# Patient Record
Sex: Female | Born: 1980 | Race: Black or African American | Hispanic: No | Marital: Married | Smoking: Never smoker
Health system: Southern US, Community
[De-identification: ages and names within clinical notes are randomized; demographics above are authoritative.]

## PROBLEM LIST (undated history)

## (undated) DIAGNOSIS — R51 Headache: Secondary | ICD-10-CM

## (undated) HISTORY — PX: DILATION AND CURETTAGE OF UTERUS: SHX78

## (undated) HISTORY — PX: COLONOSCOPY: SHX174

## (undated) HISTORY — PX: CERVICAL CONIZATION W/BX: SHX1330

---

## 2002-04-13 ENCOUNTER — Other Ambulatory Visit: Admission: RE | Admit: 2002-04-13 | Discharge: 2002-04-13 | Payer: Self-pay | Admitting: Obstetrics and Gynecology

## 2002-12-03 ENCOUNTER — Other Ambulatory Visit: Admission: RE | Admit: 2002-12-03 | Discharge: 2002-12-03 | Payer: Self-pay | Admitting: Obstetrics & Gynecology

## 2003-02-02 ENCOUNTER — Ambulatory Visit: Admission: RE | Admit: 2003-02-02 | Discharge: 2003-02-02 | Payer: Self-pay | Admitting: Gynecology

## 2003-07-29 ENCOUNTER — Other Ambulatory Visit: Admission: RE | Admit: 2003-07-29 | Discharge: 2003-07-29 | Payer: Self-pay | Admitting: Obstetrics and Gynecology

## 2005-06-12 ENCOUNTER — Observation Stay (HOSPITAL_COMMUNITY): Admission: AD | Admit: 2005-06-12 | Discharge: 2005-06-13 | Payer: Self-pay | Admitting: Obstetrics and Gynecology

## 2005-06-13 ENCOUNTER — Encounter (INDEPENDENT_AMBULATORY_CARE_PROVIDER_SITE_OTHER): Payer: Self-pay | Admitting: *Deleted

## 2005-09-06 ENCOUNTER — Ambulatory Visit (HOSPITAL_COMMUNITY): Admission: RE | Admit: 2005-09-06 | Discharge: 2005-09-06 | Payer: Self-pay | Admitting: Obstetrics and Gynecology

## 2006-06-05 ENCOUNTER — Inpatient Hospital Stay (HOSPITAL_COMMUNITY): Admission: AD | Admit: 2006-06-05 | Discharge: 2006-06-05 | Payer: Self-pay | Admitting: Obstetrics & Gynecology

## 2006-06-06 ENCOUNTER — Inpatient Hospital Stay (HOSPITAL_COMMUNITY): Admission: AD | Admit: 2006-06-06 | Discharge: 2006-06-06 | Payer: Self-pay | Admitting: Obstetrics and Gynecology

## 2006-06-25 ENCOUNTER — Inpatient Hospital Stay (HOSPITAL_COMMUNITY): Admission: AD | Admit: 2006-06-25 | Discharge: 2006-06-25 | Payer: Self-pay | Admitting: *Deleted

## 2006-08-09 ENCOUNTER — Inpatient Hospital Stay (HOSPITAL_COMMUNITY): Admission: AD | Admit: 2006-08-09 | Discharge: 2006-08-11 | Payer: Self-pay | Admitting: Obstetrics and Gynecology

## 2006-08-12 ENCOUNTER — Encounter: Admission: RE | Admit: 2006-08-12 | Discharge: 2006-09-11 | Payer: Self-pay | Admitting: Obstetrics and Gynecology

## 2006-09-12 ENCOUNTER — Encounter: Admission: RE | Admit: 2006-09-12 | Discharge: 2006-10-11 | Payer: Self-pay | Admitting: Obstetrics and Gynecology

## 2006-10-12 ENCOUNTER — Encounter: Admission: RE | Admit: 2006-10-12 | Discharge: 2006-11-11 | Payer: Self-pay | Admitting: Obstetrics and Gynecology

## 2006-11-12 ENCOUNTER — Encounter: Admission: RE | Admit: 2006-11-12 | Discharge: 2006-12-11 | Payer: Self-pay | Admitting: Obstetrics and Gynecology

## 2006-12-12 ENCOUNTER — Encounter: Admission: RE | Admit: 2006-12-12 | Discharge: 2007-01-11 | Payer: Self-pay | Admitting: Obstetrics and Gynecology

## 2007-01-12 ENCOUNTER — Encounter: Admission: RE | Admit: 2007-01-12 | Discharge: 2007-02-11 | Payer: Self-pay | Admitting: Obstetrics and Gynecology

## 2007-02-12 ENCOUNTER — Encounter: Admission: RE | Admit: 2007-02-12 | Discharge: 2007-03-13 | Payer: Self-pay | Admitting: Obstetrics and Gynecology

## 2007-03-14 ENCOUNTER — Encounter: Admission: RE | Admit: 2007-03-14 | Discharge: 2007-04-13 | Payer: Self-pay | Admitting: Obstetrics and Gynecology

## 2007-04-14 ENCOUNTER — Encounter: Admission: RE | Admit: 2007-04-14 | Discharge: 2007-04-26 | Payer: Self-pay | Admitting: Obstetrics and Gynecology

## 2008-04-19 IMAGING — US US OB COMP +14 WK
1 series · 14 of 28 positions shown · non-contrast
Comparison: none

OBSTETRICAL ULTRASOUND:

 This ultrasound exam was performed in the [HOSPITAL] Ultrasound Department.  The OB US report was generated in the AS system, and faxed to the ordering physician.  This report is also available in [REDACTED] PACS.

[Series 1: us ob comp +14 wk · 0.28mm/px · 14 of 40 slices shown]
[im 2/40]
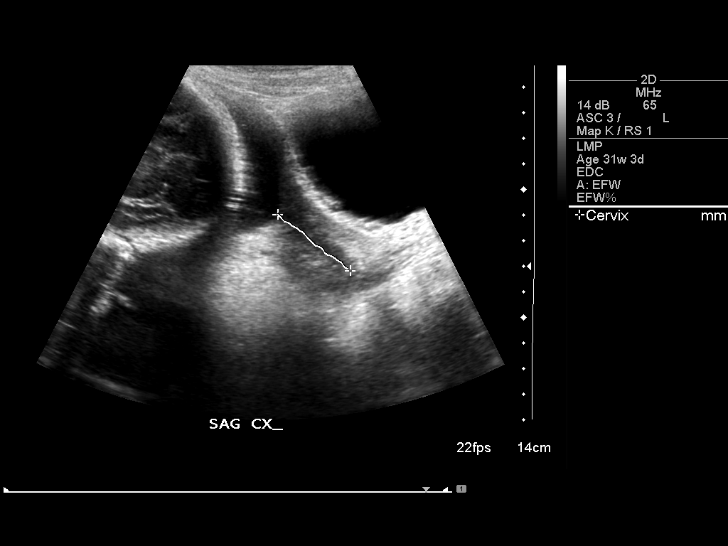
[im 5/40]
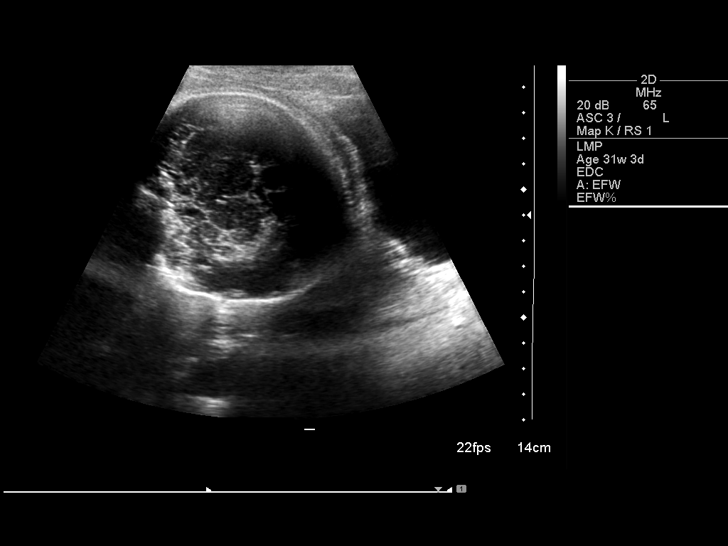
[im 8/40]
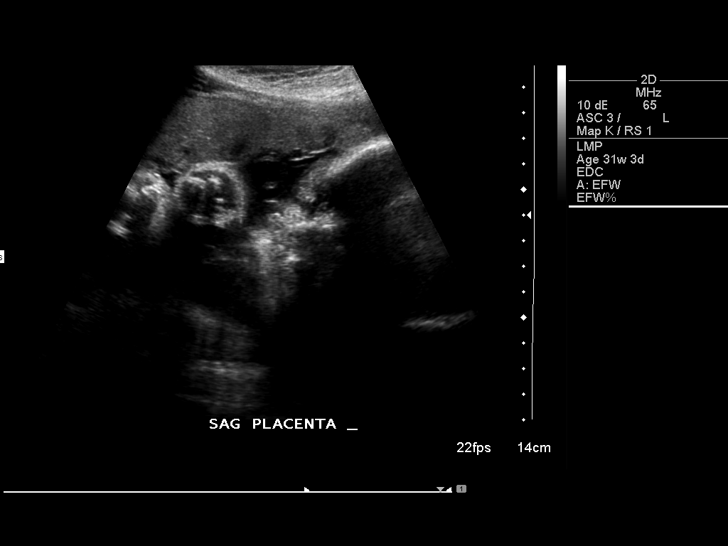
[im 11/40]
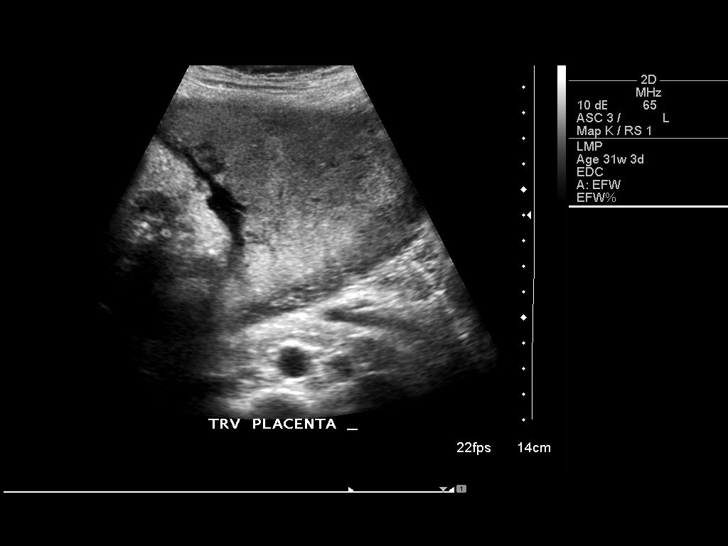
[im 14/40]
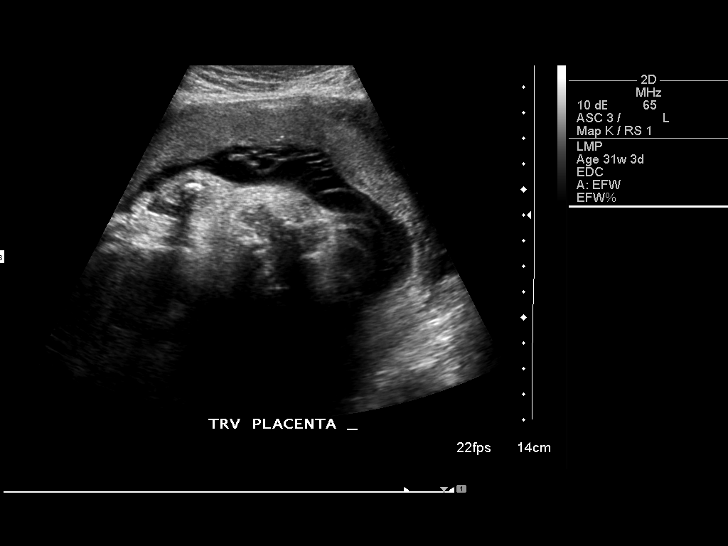
[im 16/40]
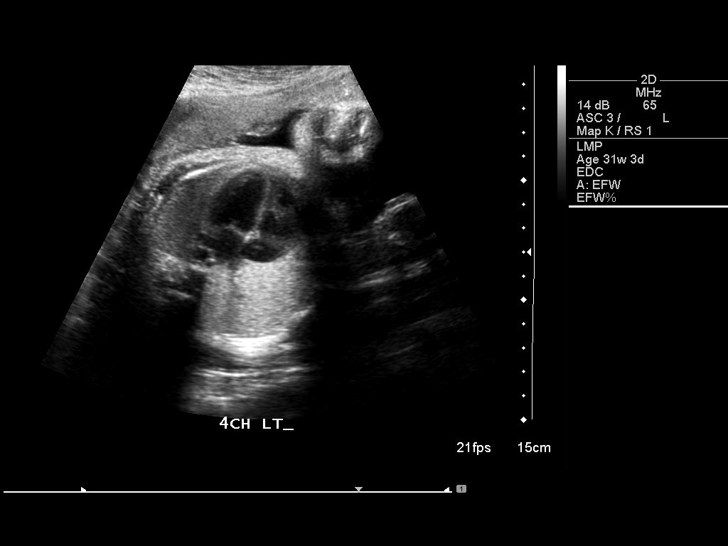
[im 19/40]
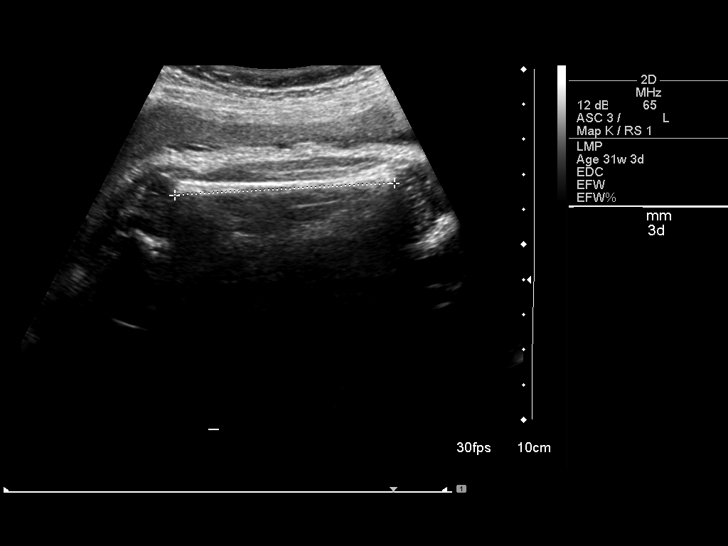
[im 22/40]
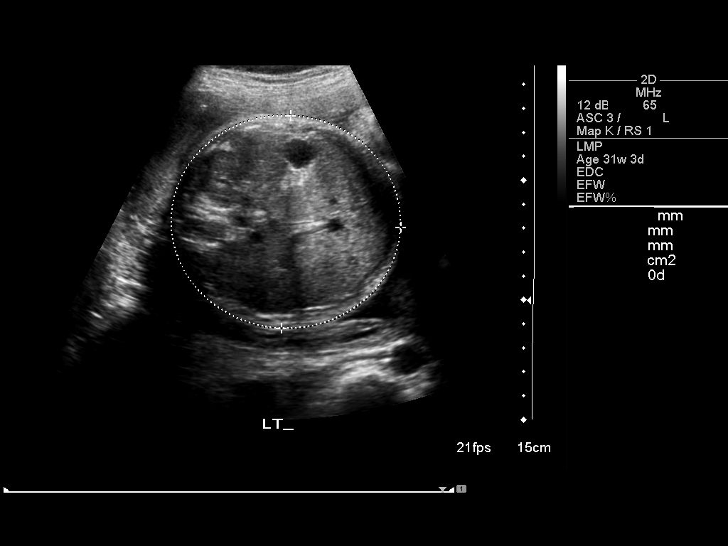
[im 25/40]
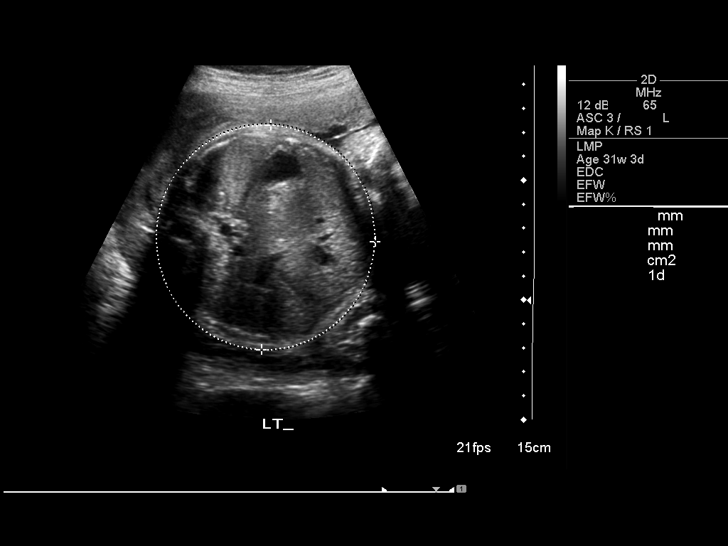
[im 28/40]
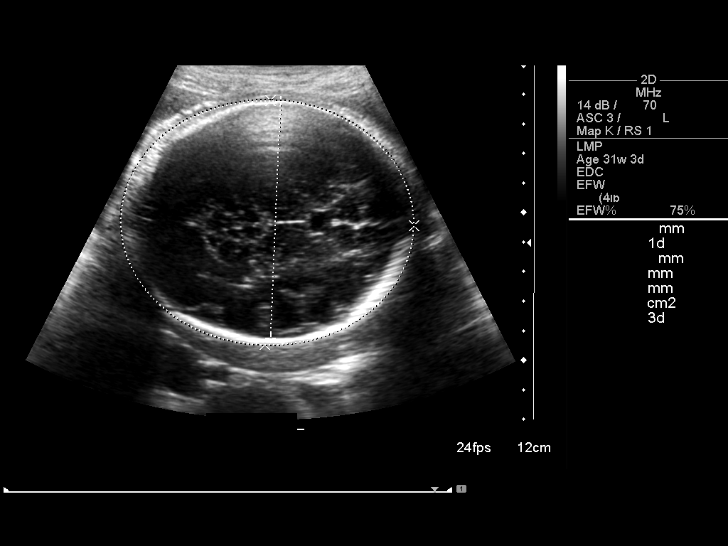
[im 31/40]
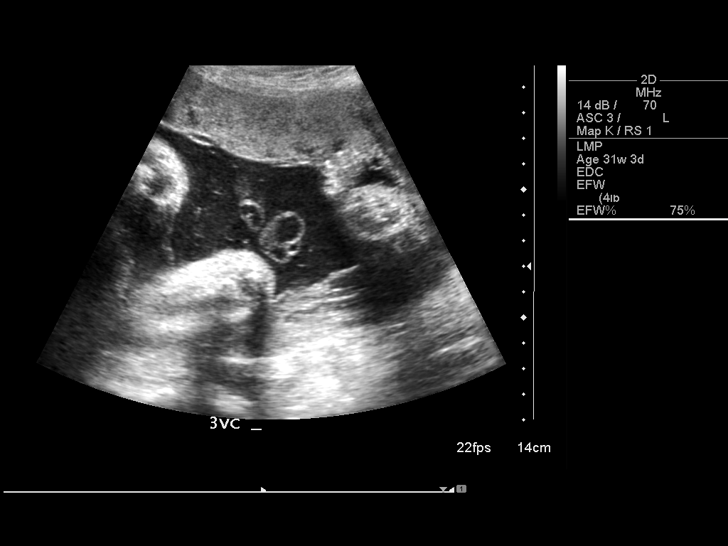
[im 34/40]
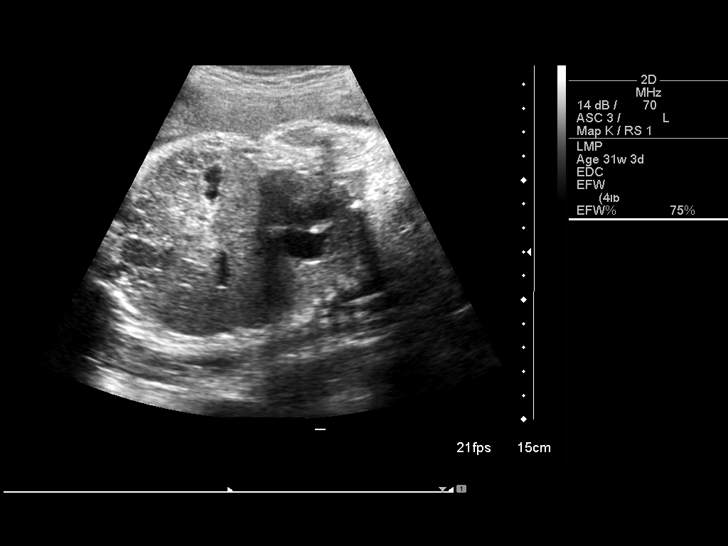
[im 37/40]
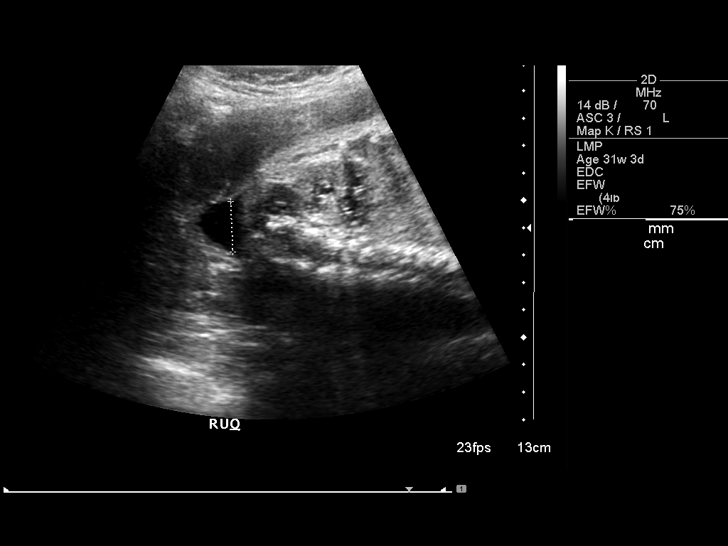
[im 40/40]
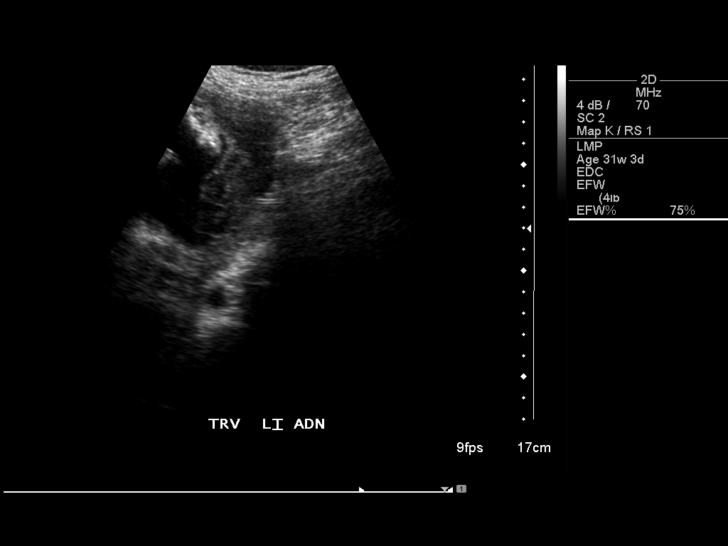

[14 of 28 positions shown; findings below may reference images not displayed]

IMPRESSION: See AS Obstetric US report.

## 2010-10-05 NOTE — Op Note (Signed)
[name], Carolyn Hood                ACCOUNT NO.:  [id number]   MEDICAL RECORD NO.:  [id number]          PATIENT TYPE:  INP   LOCATION:  9315                          FACILITY:  [facility]   PHYSICIAN:  Genia Del, M.D.DATE OF BIRTH:  [birth year]   DATE OF PROCEDURE:  06/13/2005  DATE OF DISCHARGE:                                 OPERATIVE REPORT   PREOPERATIVE DIAGNOSES:  1.  Fourteen-plus weeks gestation.  2.  Premature preterm rupture of membranes.  3.  Short cervix, probable cervical incompetence.   POSTOPERATIVE DIAGNOSES:  1.  Fourteen-plus weeks gestation.  2.  Premature preterm rupture of membranes.  3.  Short cervix, probable cervical incompetence.   OPERATION/PROCEDURE:  Dilation and evacuation.   SURGEON:  Genia Del, M.D.   DESCRIPTION OF PROCEDURE:  Under general anesthesia with endotracheal  intubation, the patient in the lithotomy position. She was prepped with  Betadine on the suprapubic, vulvar and vaginal areas and draped as usual.  The bladder was catheterized.  The vaginal exam reveals an anteverted uterus  corresponding to 12-14 weeks, mobile.  No adnexal masses.  Cervix is short  and soft.  The speculum is inserted in the vagina.  A paracervical block is  done with lidocaine 1%, 20 mL total at 4 and 8 o'clock.  We then used a  tenaculum on the anterior lip of the cervix and proceeded with dilatation of  the cervix up to Hegar dilators #37.  This is done very easily as the cervix  is very soft.  No resistance felt.  We then used a #16 curved curet for  aspiration.  We successfully suctioned the products of conception entirely  with curet.  We then used a sharp curet to verify all of the surfaces of the  intrauterine cavity.  No additional products of conception are brought out.  The uterine sound is heard throughout.  We then used the #14 curved suction  curet and suctioned the intrauterine cavity.  The uterus contracts well on  the curet.  No further  products of conception are brought out.  Hemostasis  is adequate.  The uterus contracts well.  All instruments are removed.  Silver nitrate was used on one entrance of the tenaculum on the cervix.  That completed hemostasis very well.  The estimated blood loss was about 300  mL.  No complications occurred.  The patient was brought to the recovery  room in good status. Note that Pitocin IV was given after the evacuation.  The patient will be on Flagyl 500 mg p.o. b.i.d. for seven days.  She will  start on birth control pills with the generic of Ortho Tri-Cyclen this  coming Sunday.  Usage and risks were reviewed with the patient.      Genia Del, M.D.  Electronically Signed     ML/MEDQ  D:  06/13/2005  T:  06/14/2005  Job:  [id number]

## 2010-10-05 NOTE — Consult Note (Signed)
NAME:  Carolyn Hood, Carolyn Hood                          ACCOUNT NO.:  [id number]   MEDICAL RECORD NO.:  [id number]                   PATIENT TYPE:  OUT   LOCATION:  GYN                                  FACILITY:  [facility]   PHYSICIAN:  De Blanch, M.D.         DATE OF BIRTH:  December 12, 1980   DATE OF CONSULTATION:  02/02/2003  DATE OF DISCHARGE:                                   CONSULTATION   This 30 year old African-American female seen in consultation at the request  of Maxie Better, M.D. regarding abnormal Pap smear.   The patient underwent laser conization in November 2000 with a final  pathology report showing CIN II-III.  Surgical margins were negative and  endocervical curettage was negative.  The patient apparently had normal Pap  smears between 2000-2003.  In November 2003 the patient had another Pap  smear showing a low grade SIL lesion.  Colposcopy and biopsy revealed a  negative cervical biopsy but a low grade SIL was found on vaginal biopsy.  Subsequently, the patient was treated with TCA to the cervix.  Repeat Pap  smear in July of 2004 revealed again low grade SIL.  The patient  subsequently underwent colposcopy and biopsies in August of 2004 which were  negative.  The patient is seen in consultation regarding management options.   PAST MEDICAL HISTORY:  None.   PAST SURGICAL HISTORY:  None.   SOCIAL HISTORY:  The patient does not smoke.  She works at [employer] [place]  in [employer].  She has one living child who is 38 years of age.   CURRENT MEDICATIONS:  None.   MEDICATIONS:  The patient uses Depo-Provera for contraception.   REVIEW OF SYSTEMS:  Otherwise negative.   PHYSICAL EXAMINATION:  VITAL SIGNS:  Weight 117 pounds, height 5 feet 3  inches, blood pressure 126/76, pulse 100, respiratory rate 24.  GENERAL:  The patient is a healthy black female in no acute distress.  ABDOMEN:  Soft, nontender.  No mass, organomegaly, ascites, or hernias  are  noted.  PELVIC:  EGBUS, vagina, bladder, urethra are normal.  Cervix is deviated  slightly to the right and is somewhat shorter than one would expect.  Cervical os appears patent.  There are no gross lesions noted.  The uterus  is anterior, normal shape, size, consistency.  There are no adnexal masses  noted.   Colposcopic examination of the cervix and vagina is carried out.  I do not  see any abnormalities in the cervix or vagina.  The squamocolumnar junction  is not really well outlined, however.   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   I had a lengthy discussion with the patient and her fiance regarding this  disease process and the natural history of it.  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  We therefore would  recommend that only lesions can be identified clinically be eradicated.  Given the low grade nature of her recurrent Pap smears, I think it is  unlikely this will progress to high grade or invasive cancer.  Therefore, I  would recommend she have repeat Pap smears at six month intervals in  Maxie Better, M.D. office and no further evaluation unless the Pap  smear worsens.  All the patient's questions are answered and she is  comfortable with these recommendations.  She will return to the care of  Maxie Better, M.D. in six months or as needed.                                               De Blanch, M.D.    DC/MEDQ  D:  02/02/2003  T:  02/02/2003  Job:  [id number]   cc:   Maxie Better, M.D.  [address]  [address]  [place]  [place] [address]  Fax: [phone]   Telford Nab, R.N.  [phone]  [place], [place] [address]

## 2011-06-06 ENCOUNTER — Other Ambulatory Visit: Payer: Self-pay | Admitting: Family Medicine

## 2011-06-06 DIAGNOSIS — E041 Nontoxic single thyroid nodule: Secondary | ICD-10-CM

## 2011-06-10 ENCOUNTER — Other Ambulatory Visit: Payer: Self-pay

## 2011-06-13 ENCOUNTER — Ambulatory Visit
Admission: RE | Admit: 2011-06-13 | Discharge: 2011-06-13 | Disposition: A | Discharge status: Home / Self Care | Payer: Managed Care, Other (non HMO) | Source: Ambulatory Visit | Attending: Family Medicine | Admitting: Family Medicine

## 2011-06-13 DIAGNOSIS — E041 Nontoxic single thyroid nodule: Secondary | ICD-10-CM

## 2011-08-30 ENCOUNTER — Other Ambulatory Visit: Payer: Self-pay | Admitting: Gastroenterology

## 2013-04-07 IMAGING — US US SOFT TISSUE HEAD/NECK
1 series · 14 of 25 positions shown · non-contrast
Comparison: None.

CLINICAL DATA: Nodule on physical exam

THYROID ULTRASOUND
TECHNIQUE: Ultrasound examination of the thyroid gland and adjacent
soft tissues was performed.

[Series 1: us soft tissue head/neck · 0.06mm/px · 14 of 50 slices shown]
[im 1/50]
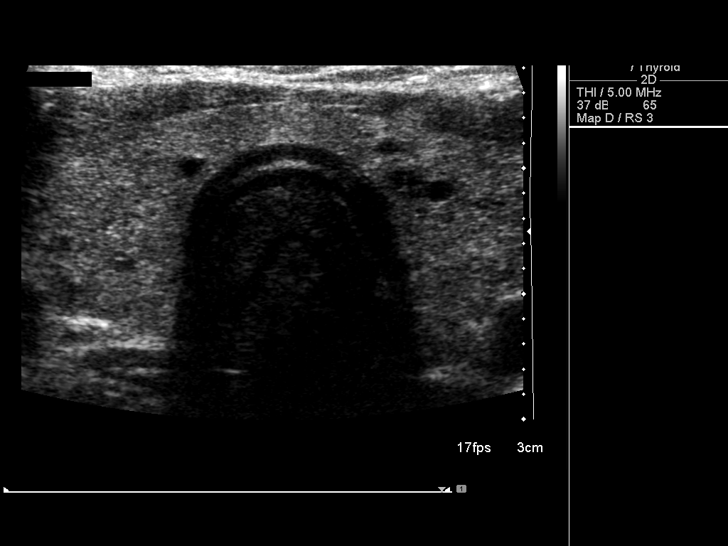
[im 5/50]
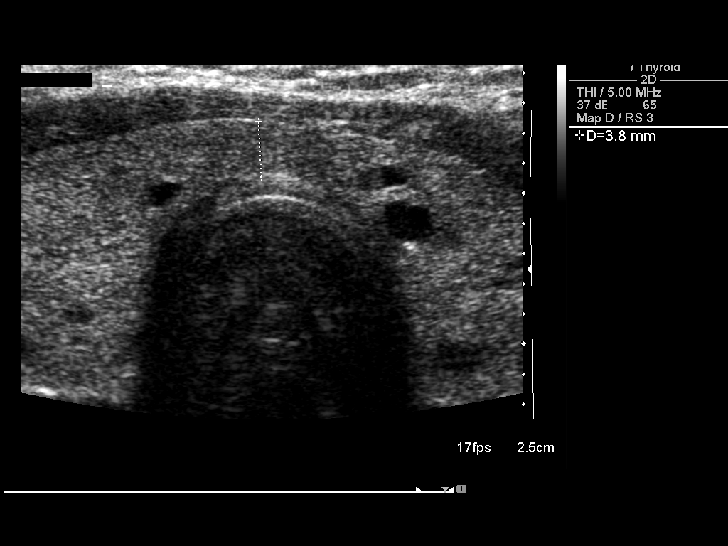
[im 9/50]
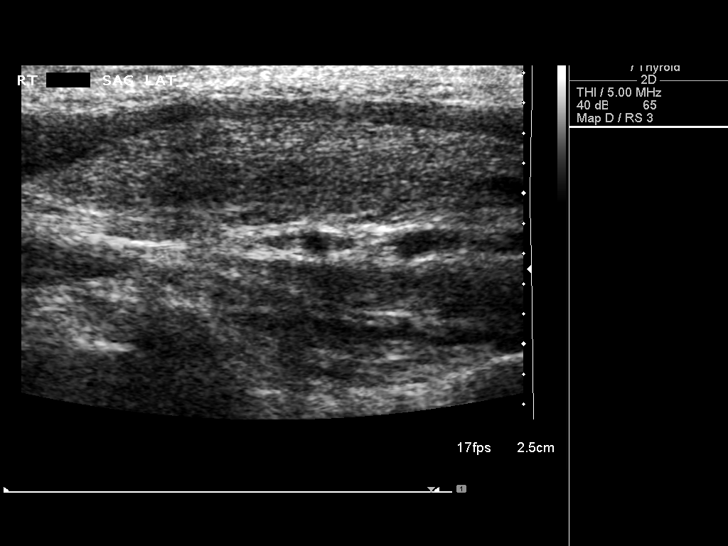
[im 13/50]
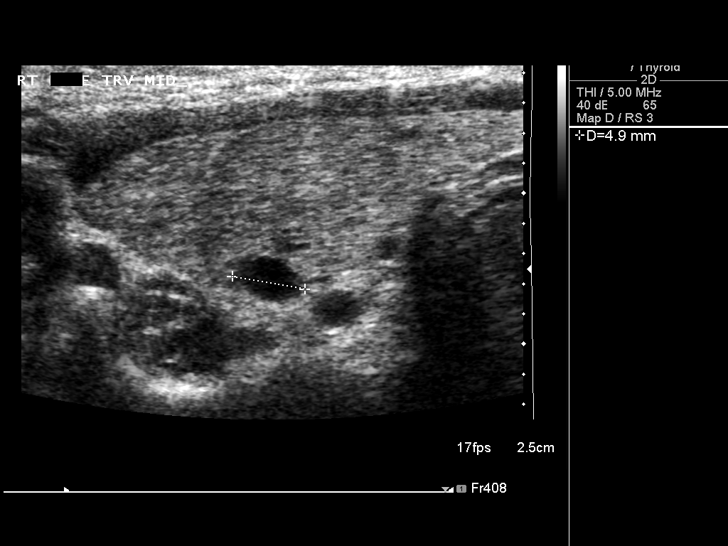
[im 17/50]
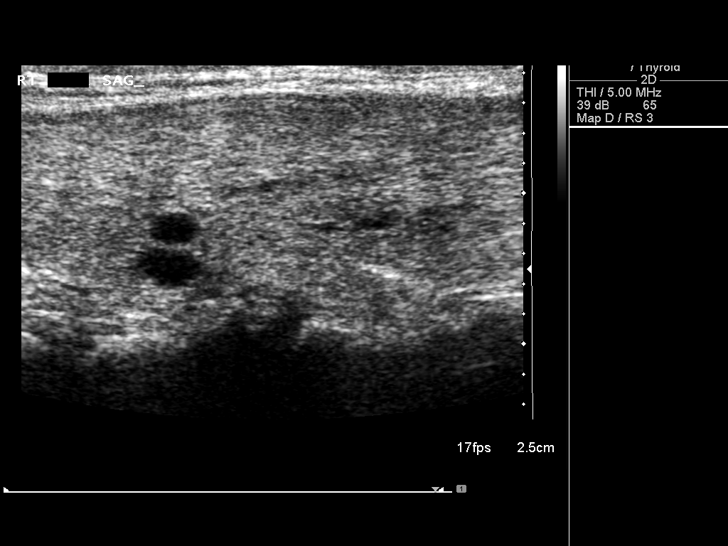
[im 19/50]
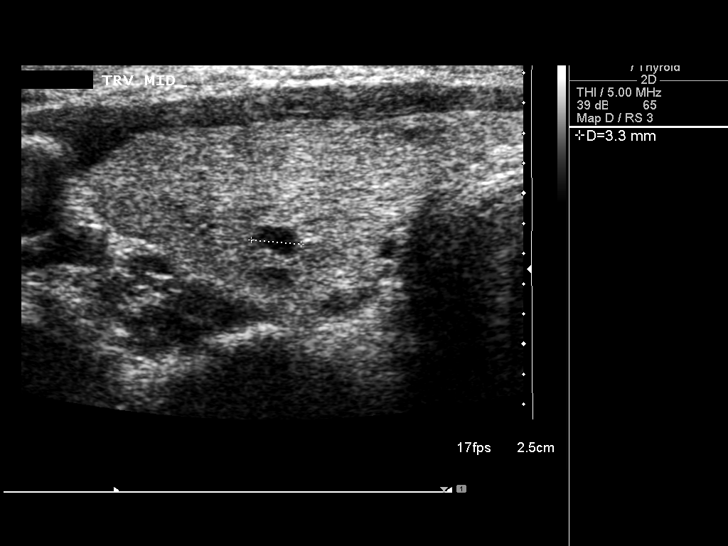
[im 23/50]
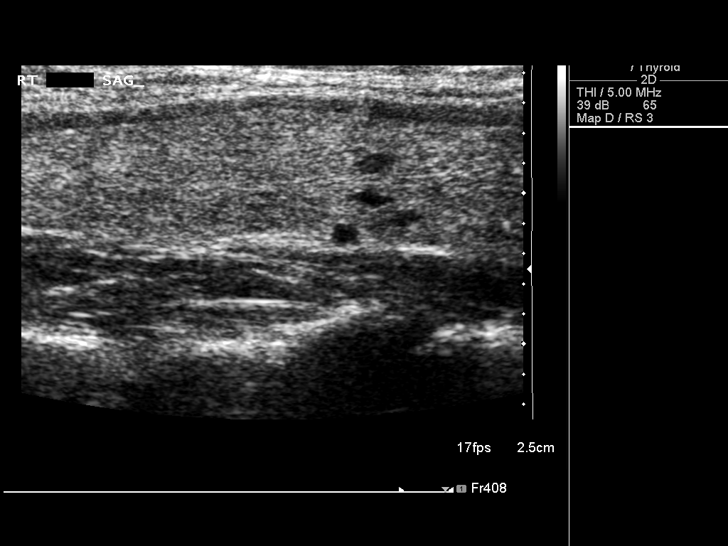
[im 27/50]
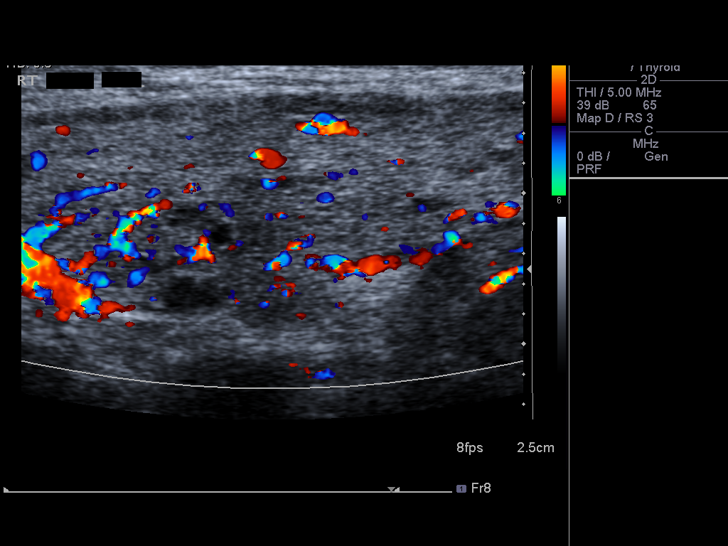
[im 31/50]
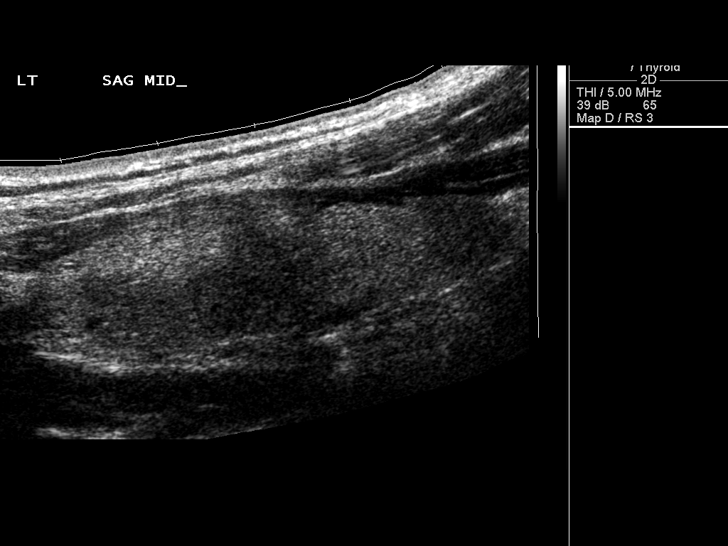
[im 33/50]
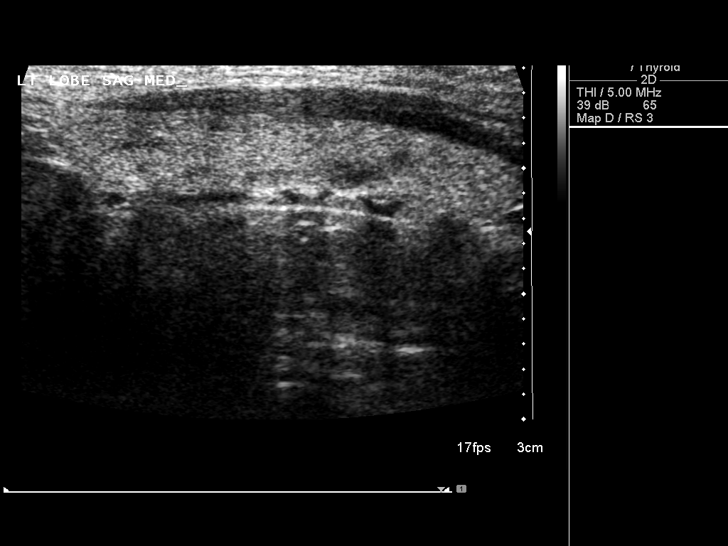
[im 37/50]
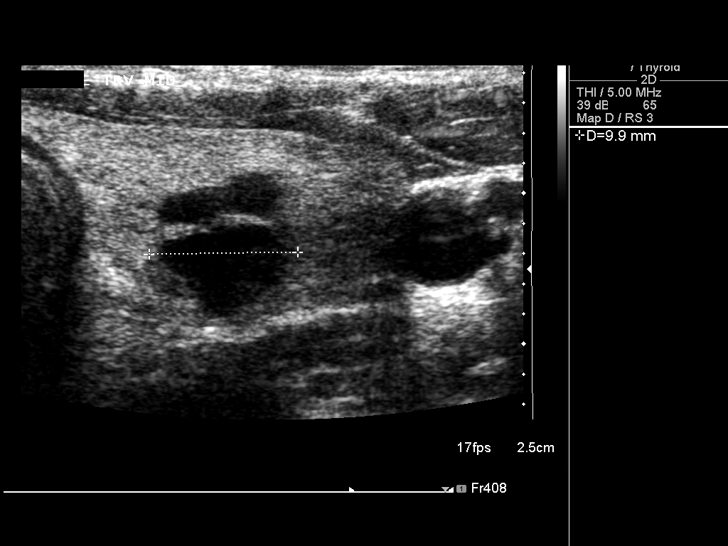
[im 41/50]
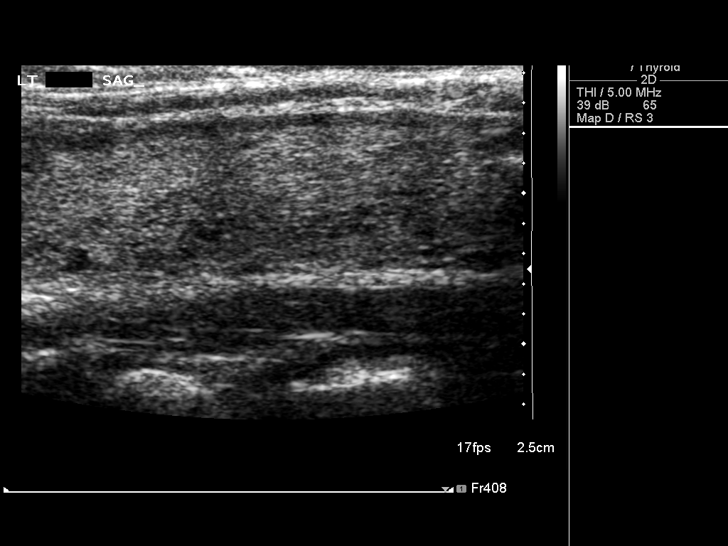
[im 45/50]
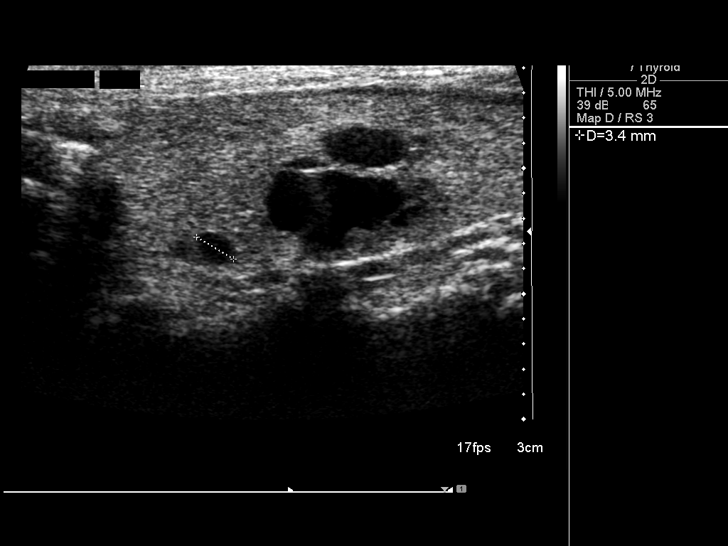
[im 50/50]
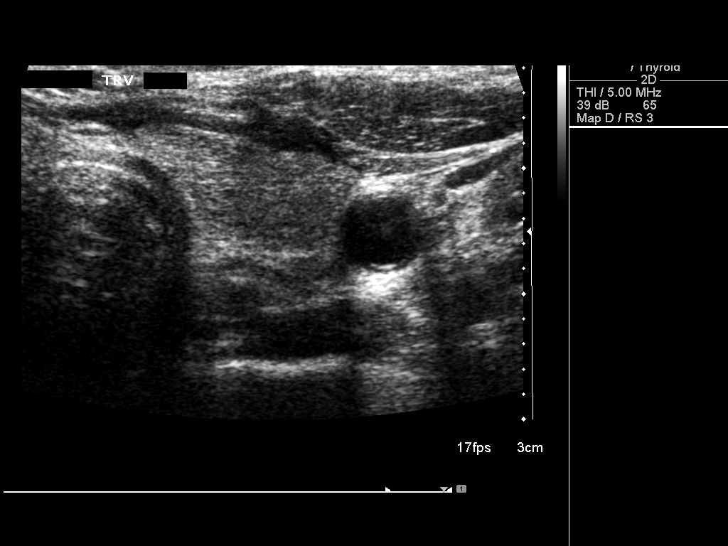

[14 of 25 positions shown; findings below may reference images not displayed]

FINDINGS: Right thyroid lobe:  18 x 19 x 55 mm
Left thyroid lobe:  16 x 23 x 52 mm
Isthmus:  4 mm in thickness

Focal nodules:  10 x 11 x 15 mm complex cystic, mid-left
Multiple additional small cystic lesions are noted bilaterally,
none measuring greater than 5 mm maximal diameter.

Lymphadenopathy:  None visualized.
IMPRESSION: 1.  Small bilateral cystic thyroid lesions.  None meets consensus
criteria for biopsy.  Follow up should be based on clinical
parameters. This recommendation follows the consensus statement:
Management of Thyroid Nodules Detected as US:  Society of
Radiologists in Ultrasound Consensus Conference Statement.
[URL]

## 2013-05-25 ENCOUNTER — Other Ambulatory Visit: Payer: Self-pay | Admitting: Obstetrics and Gynecology

## 2013-05-27 ENCOUNTER — Encounter (HOSPITAL_COMMUNITY): Payer: Self-pay

## 2013-06-01 ENCOUNTER — Encounter (HOSPITAL_COMMUNITY): Payer: Self-pay | Admitting: Pharmacist

## 2013-06-11 ENCOUNTER — Encounter (HOSPITAL_COMMUNITY): Payer: Self-pay | Admitting: Anesthesiology

## 2013-06-11 ENCOUNTER — Encounter (HOSPITAL_COMMUNITY): Payer: BC Managed Care – PPO | Admitting: Anesthesiology

## 2013-06-11 ENCOUNTER — Ambulatory Visit (HOSPITAL_COMMUNITY)
Admission: RE | Admit: 2013-06-11 | Discharge: 2013-06-11 | Disposition: A | Discharge status: Home / Self Care | Payer: BC Managed Care – PPO | Source: Ambulatory Visit | Attending: Obstetrics and Gynecology | Admitting: Obstetrics and Gynecology

## 2013-06-11 ENCOUNTER — Encounter (HOSPITAL_COMMUNITY)
Admission: RE | Disposition: A | Discharge status: Home / Self Care | Payer: Self-pay | Source: Ambulatory Visit | Attending: Obstetrics and Gynecology

## 2013-06-11 ENCOUNTER — Ambulatory Visit (HOSPITAL_COMMUNITY): Payer: BC Managed Care – PPO | Admitting: Anesthesiology

## 2013-06-11 DIAGNOSIS — N925 Other specified irregular menstruation: Secondary | ICD-10-CM | POA: Insufficient documentation

## 2013-06-11 DIAGNOSIS — N840 Polyp of corpus uteri: Secondary | ICD-10-CM | POA: Insufficient documentation

## 2013-06-11 DIAGNOSIS — N949 Unspecified condition associated with female genital organs and menstrual cycle: Secondary | ICD-10-CM | POA: Insufficient documentation

## 2013-06-11 DIAGNOSIS — Z9889 Other specified postprocedural states: Secondary | ICD-10-CM

## 2013-06-11 DIAGNOSIS — N938 Other specified abnormal uterine and vaginal bleeding: Secondary | ICD-10-CM | POA: Insufficient documentation

## 2013-06-11 HISTORY — DX: Headache: R51

## 2013-06-11 HISTORY — PX: DILATATION & CURRETTAGE/HYSTEROSCOPY WITH RESECTOCOPE: SHX5572

## 2013-06-11 LAB — CBC
HCT: 30.4 % — ABNORMAL LOW (ref 36.0–46.0)
Hemoglobin: 9.6 g/dL — ABNORMAL LOW (ref 12.0–15.0)
MCH: 22.5 pg — AB (ref 26.0–34.0)
MCHC: 31.6 g/dL (ref 30.0–36.0)
MCV: 71.4 fL — AB (ref 78.0–100.0)
Platelets: 237 10*3/uL (ref 150–400)
RBC: 4.26 MIL/uL (ref 3.87–5.11)
RDW: 18.1 % — ABNORMAL HIGH (ref 11.5–15.5)
WBC: 5.7 10*3/uL (ref 4.0–10.5)

## 2013-06-11 LAB — PREGNANCY, URINE: PREG TEST UR: NEGATIVE

## 2013-06-11 SURGERY — DILATATION & CURETTAGE/HYSTEROSCOPY WITH RESECTOCOPE
Anesthesia: General | Site: Uterus

## 2013-06-11 MED ORDER — KETOROLAC TROMETHAMINE 60 MG/2ML IM SOLN
INTRAMUSCULAR | Status: DC | PRN
  Administered 2013-06-11: 30 mg via INTRAMUSCULAR

## 2013-06-11 MED ORDER — LIDOCAINE HCL (CARDIAC) 20 MG/ML IV SOLN
INTRAVENOUS | Status: DC | PRN
  Administered 2013-06-11: 50 mg via INTRAVENOUS

## 2013-06-11 MED ORDER — ONDANSETRON HCL 4 MG/2ML IJ SOLN
INTRAMUSCULAR | Status: AC
  Filled 2013-06-11: qty 2

## 2013-06-11 MED ORDER — FENTANYL CITRATE 0.05 MG/ML IJ SOLN
25.0000 ug | INTRAMUSCULAR | Status: DC | PRN
  Administered 2013-06-11: 25 ug via INTRAVENOUS

## 2013-06-11 MED ORDER — KETOROLAC TROMETHAMINE 30 MG/ML IJ SOLN
INTRAMUSCULAR | Status: DC | PRN
  Administered 2013-06-11: 30 mg via INTRAVENOUS

## 2013-06-11 MED ORDER — PROPOFOL 10 MG/ML IV BOLUS
INTRAVENOUS | Status: DC | PRN
  Administered 2013-06-11: 200 mg via INTRAVENOUS

## 2013-06-11 MED ORDER — KETOROLAC TROMETHAMINE 30 MG/ML IJ SOLN
INTRAMUSCULAR | Status: AC
  Filled 2013-06-11: qty 2

## 2013-06-11 MED ORDER — DEXAMETHASONE SODIUM PHOSPHATE 10 MG/ML IJ SOLN
INTRAMUSCULAR | Status: DC | PRN
  Administered 2013-06-11: 10 mg via INTRAVENOUS

## 2013-06-11 MED ORDER — ONDANSETRON HCL 4 MG/2ML IJ SOLN
INTRAMUSCULAR | Status: DC | PRN
  Administered 2013-06-11: 4 mg via INTRAVENOUS

## 2013-06-11 MED ORDER — FENTANYL CITRATE 0.05 MG/ML IJ SOLN
INTRAMUSCULAR | Status: DC | PRN
  Administered 2013-06-11 (×2): 50 ug via INTRAVENOUS

## 2013-06-11 MED ORDER — DEXAMETHASONE SODIUM PHOSPHATE 10 MG/ML IJ SOLN
INTRAMUSCULAR | Status: AC
  Filled 2013-06-11: qty 1

## 2013-06-11 MED ORDER — PROPOFOL 10 MG/ML IV EMUL
INTRAVENOUS | Status: AC
  Filled 2013-06-11: qty 20

## 2013-06-11 MED ORDER — FENTANYL CITRATE 0.05 MG/ML IJ SOLN
INTRAMUSCULAR | Status: AC
  Filled 2013-06-11: qty 2

## 2013-06-11 MED ORDER — LACTATED RINGERS IV SOLN
INTRAVENOUS | Status: DC
  Administered 2013-06-11: 13:00:00 via INTRAVENOUS

## 2013-06-11 MED ORDER — MIDAZOLAM HCL 2 MG/2ML IJ SOLN
INTRAMUSCULAR | Status: DC | PRN
  Administered 2013-06-11: 1 mg via INTRAVENOUS

## 2013-06-11 MED ORDER — MIDAZOLAM HCL 2 MG/2ML IJ SOLN
INTRAMUSCULAR | Status: AC
  Filled 2013-06-11: qty 2

## 2013-06-11 MED ORDER — IBUPROFEN 800 MG PO TABS: 800.0000 mg | ORAL_TABLET | Freq: Three times a day (TID) | ORAL | Status: DC | PRN

## 2013-06-11 MED ORDER — METOCLOPRAMIDE HCL 5 MG/ML IJ SOLN: 10.0000 mg | Freq: Once | INTRAMUSCULAR | Status: DC | PRN

## 2013-06-11 MED ORDER — FENTANYL CITRATE 0.05 MG/ML IJ SOLN
INTRAMUSCULAR | Status: AC
  Filled 2013-06-11: qty 5

## 2013-06-11 MED ORDER — LIDOCAINE HCL (CARDIAC) 20 MG/ML IV SOLN
INTRAVENOUS | Status: AC
  Filled 2013-06-11: qty 5

## 2013-06-11 MED ORDER — MEPERIDINE HCL 25 MG/ML IJ SOLN: 6.2500 mg | INTRAMUSCULAR | Status: DC | PRN

## 2013-06-11 SURGICAL SUPPLY — 17 items
CANISTER SUCT 3000ML (MISCELLANEOUS) ×3 IMPLANT
CATH ROBINSON RED A/P 16FR (CATHETERS) ×3 IMPLANT
CLOTH BEACON ORANGE TIMEOUT ST (SAFETY) ×3 IMPLANT
CONTAINER PREFILL 10% NBF 60ML (FORM) ×6 IMPLANT
DRSG TELFA 3X8 NADH (GAUZE/BANDAGES/DRESSINGS) ×3 IMPLANT
ELECT REM PT RETURN 9FT ADLT (ELECTROSURGICAL) ×3
ELECTRODE REM PT RTRN 9FT ADLT (ELECTROSURGICAL) ×1 IMPLANT
GLOVE BIOGEL PI IND STRL 7.0 (GLOVE) ×2 IMPLANT
GLOVE BIOGEL PI INDICATOR 7.0 (GLOVE) ×4
GLOVE ECLIPSE 6.5 STRL STRAW (GLOVE) ×3 IMPLANT
GOWN STRL REIN XL XLG (GOWN DISPOSABLE) ×6 IMPLANT
LOOP ANGLED CUTTING 22FR (CUTTING LOOP) ×2 IMPLANT
PACK HYSTEROSCOPY LF (CUSTOM PROCEDURE TRAY) ×3 IMPLANT
PAD DRESSING TELFA 3X8 NADH (GAUZE/BANDAGES/DRESSINGS) ×1 IMPLANT
PAD OB MATERNITY 4.3X12.25 (PERSONAL CARE ITEMS) ×3 IMPLANT
TOWEL OR 17X24 6PK STRL BLUE (TOWEL DISPOSABLE) ×6 IMPLANT
WATER STERILE IRR 1000ML POUR (IV SOLUTION) ×3 IMPLANT

## 2013-06-11 NOTE — Discharge Instructions (Signed)
CALL  IF TEMP>100.4, NOTHING PER VAGINA X 1 WK, CALL IF SOAKING A MAXI  PAD EVERY HOUR OR MORE FREQUENTLY  Post Anesthesia Home Care Instructions  Activity: Get plenty of rest for the remainder of the day. A responsible adult should stay with you for 24 hours following the procedure.  For the next 24 hours, DO NOT: -Drive a car -Operate machinery -Drink alcoholic beverages -Take any medication unless instructed by your physician -Make any legal decisions or sign important papers.  Meals: Start with liquid foods such as gelatin or soup. Progress to regular foods as tolerated. Avoid greasy, spicy, heavy foods. If nausea and/or vomiting occur, drink only clear liquids until the nausea and/or vomiting subsides. Call your physician if vomiting continues.  Special Instructions/Symptoms: Your throat may feel dry or sore from the anesthesia or the breathing tube placed in your throat during surgery. If this causes discomfort, gargle with warm salt water. The discomfort should disappear within 24 hours.       

## 2013-06-11 NOTE — Transfer of Care (Signed)
Immediate Anesthesia Transfer of Care Note  Patient: Carolyn Hood  Procedure(s) Performed: Procedure(s): DILATATION & CURETTAGE/HYSTEROSCOPY WITH RESECTOCOPE (N/A)  Patient Location: PACU  Anesthesia Type:General  Level of Consciousness: awake, alert  and oriented  Airway & Oxygen Therapy: Patient Spontanous Breathing and Patient connected to nasal cannula oxygen  Post-op Assessment: Report given to PACU RN and Post -op Vital signs reviewed and stable  Post vital signs: Reviewed and stable  Complications: No apparent anesthesia complications

## 2013-06-11 NOTE — Brief Op Note (Signed)
06/11/2013  3:11 PM  PATIENT:  Carolyn Hood  33 y.o. female  PRE-OPERATIVE DIAGNOSIS:  Endometrial Mass, Dysfunctional Uterine Bleeding  POST-OPERATIVE DIAGNOSIS:  Endometrial Mass, Dysfunctional Uterine Bleeding  PROCEDURE:  Diagnostic hysteroscopy, hysteroscopic resection of endometrial polyp, dilation and curettage  SURGEON:  Surgeon(s) and Role:    * Myliah Medel Cathie BeamsA Brentney Goldbach, MD - Primary  PHYSICIAN ASSISTANT:   ASSISTANTS: none   ANESTHESIA:   general  EBL:  Total I/O In: 500 [I.V.:500] Out: -   BLOOD ADMINISTERED:none  DRAINS: none   LOCAL MEDICATIONS USED:  NONE  SPECIMEN:  Source of Specimen:  emc w/ polyp  DISPOSITION OF SPECIMEN:  PATHOLOGY  COUNTS:  YES  TOURNIQUET:  * No tourniquets in log *  DICTATION: .Other Dictation: Dictation Number [id number]  PLAN OF CARE: Discharge to home after PACU  PATIENT DISPOSITION:  PACU - hemodynamically stable.   Delay start of Pharmacological VTE agent (>24hrs) due to surgical blood loss or risk of bleeding: no

## 2013-06-11 NOTE — Anesthesia Preprocedure Evaluation (Signed)
Anesthesia Evaluation  Patient identified by MRN, date of birth, ID band Patient awake    Reviewed: Allergy & Precautions, H&P , Patient's Chart, lab work & pertinent test results  Airway Mallampati: II TM Distance: >3 FB Neck ROM: Full    Dental no notable dental hx. (+) Teeth Intact   Pulmonary neg pulmonary ROS,  breath sounds clear to auscultation  Pulmonary exam normal       Cardiovascular negative cardio ROS  Rhythm:Regular Rate:Normal     Neuro/Psych  Headaches, negative psych ROS   GI/Hepatic negative GI ROS, Neg liver ROS,   Endo/Other  negative endocrine ROS  Renal/GU negative Renal ROS  negative genitourinary   Musculoskeletal negative musculoskeletal ROS (+)   Abdominal   Peds  Hematology  (+) Sickle cell trait ,   Anesthesia Other Findings   Reproductive/Obstetrics Endometrial mass DUB                           Anesthesia Physical Anesthesia Plan  ASA: II  Anesthesia Plan: General   Post-op Pain Management:    Induction: Intravenous  Airway Management Planned: LMA  Additional Equipment:   Intra-op Plan:   Post-operative Plan: Extubation in OR  Informed Consent: I have reviewed the patients History and Physical, chart, labs and discussed the procedure including the risks, benefits and alternatives for the proposed anesthesia with the patient or authorized representative who has indicated his/her understanding and acceptance.   Dental advisory given  Plan Discussed with: Anesthesiologist, CRNA and Surgeon  Anesthesia Plan Comments:         Anesthesia Quick Evaluation

## 2013-06-11 NOTE — Anesthesia Postprocedure Evaluation (Signed)
  Anesthesia Post-op Note  Patient: Carolyn Hood  Procedure(s) Performed: Procedure(s): DILATATION & CURETTAGE/HYSTEROSCOPY WITH RESECTOCOPE (N/A)  Patient Location: PACU  Anesthesia Type:General  Level of Consciousness: awake, alert  and oriented  Airway and Oxygen Therapy: Patient Spontanous Breathing  Post-op Pain: none  Post-op Assessment: Post-op Vital signs reviewed, Patient's Cardiovascular Status Stable, Respiratory Function Stable, Patent Airway, No signs of Nausea or vomiting and Pain level controlled  Post-op Vital Signs: Reviewed and stable  Complications: No apparent anesthesia complications

## 2013-06-11 NOTE — H&P (Signed)
Carolyn Hood is an 33 y.o. female. [id number] MBF presents for surgical management of endometrial mass noted on sono hysterogram done when endometrial thickening was found on evaluation of abnormal bleeding  Pertinent Gynecological History: Menses: flow is excessive with use of super pads or tampons on heaviest days Bleeding: intermenstrual bleeding Contraception: vasectomy DES exposure: denies Blood transfusions: none Sexually transmitted diseases: past history: trichomoniasis, chlamydia Previous GYN Procedures: none  Last mammogram: n/a Date: n/a Last pap: normal Date: 08/04/2012 OB History: G3, P2   Menstrual History: Menarche age:  n/a Patient's last menstrual period was 04/25/2013.    Past Medical History  Diagnosis Date  . Headache(784.0)     migraines    Past Surgical History  Procedure Laterality Date  . Dilation and curettage of uterus    . Cervical conization w/bx    . Colonoscopy      History reviewed. No pertinent family history.  Social History:  reports that she has never smoked. She does not have any smokeless tobacco history on file. She reports that she drinks alcohol. She reports that she does not use illicit drugs.  Allergies: No Known Allergies  No prescriptions prior to admission    Review of Systems  All other systems reviewed and are negative.    Height 5\' 3"  (1.6 m), weight 60.328 kg (133 lb), last menstrual period 04/25/2013. Physical Exam  Constitutional: She is oriented to person, place, and time. She appears well-developed and well-nourished.  HENT:  Head: Normocephalic.  Eyes: EOM are normal.  Neck: Neck supple.  Cardiovascular: Regular rhythm.   Respiratory: Breath sounds normal.  GI: Soft.  Genitourinary: Vagina normal and uterus normal.  Musculoskeletal: Normal range of motion.  Neurological: She is alert and oriented to person, place, and time.  Skin: Skin is warm and dry.    No results found for this or any previous visit  (from the past 24 hour(s)).  No results found.   Assessment/Plan: Endometrial mass DUB P) dx hysteroscopy, D&C, resection of endometrial mass. Procedure reviewed. Risk disc including but not limited to infection, bleeding, fluid overload and its mgmt., thermal injury , injury to surrounding organ structures, uterine perforation  And its mgmt/consequences. All ? answered    Momina Hunton A 06/11/2013, 7:24 AM

## 2013-06-12 NOTE — Op Note (Signed)
NAMConsuello Hood:  Reigel, Mabrey                ACCOUNT NO.:  [id number]  MEDICAL RECORD NO.:  [id number]  LOCATION:  [facility]                          FACILITY:  [facility]  PHYSICIAN:  Maxie BetterSheronette Ferlin Fairhurst, M.D.DATE OF BIRTH:  March 07, 1981  DATE OF PROCEDURE:  06/11/2013 DATE OF DISCHARGE:  06/11/2013                              OPERATIVE REPORT   PREOPERATIVE DIAGNOSES:  Dysfunctional uterine bleeding, endometrial mass.  PROCEDURE:  Diagnostic hysteroscopy, hysteroscopic resection of endometrial polyp, dilation and curettage.  POSTOPERATIVE DIAGNOSES:  Dysfunctional uterine bleeding, endometrial mass/polyp.  ANESTHESIA:  General.  SURGEON:  Maxie BetterSheronette Monzerrat Wellen, M.D.  ASSISTANTS:  None.  DESCRIPTION OF PROCEDURE:  Under adequate general anesthesia, the patient was placed in the dorsal lithotomy position.  She was sterilely prepped and draped in usual fashion.  Bladder was catheterized for moderate amount of urine.  Examination under anesthesia revealed a retroverted uterus.  No adnexal masses could be appreciated.  A bivalve speculum was placed in vagina.  Single-tooth tenaculum was placed on the anterior lip of the cervix.  The cervix was gently dilated up to #31 [facility] dilator.  Glycine primed resectoscope was introduced into the uterine cavity.  Predominantly some focal thickening was noted throughout the endometrial cavity and posteriorly, there was a more prominent mass consistent with a polyp noted.  Using the single loop, the polyp was resected.  The resectoscope was removed.  The cavity was then curetted for moderate amount of tissue.  When the resectoscope was inserted and no further lesions noted, procedure was terminated by removing all instruments.  SPECIMEN:  Endometrial curetting with polyps sent to Pathology.  ESTIMATED BLOOD LOSS:  Less than 30 mL.  FLUID DEFICIT:  20 mL.  COMPLICATION:  None.  The patient tolerated procedure well, was transferred to recovery in stable  condition.     Maxie BetterSheronette Kenleigh Toback, M.D.     [place]/MEDQ  D:  06/11/2013  T:  06/12/2013  Job:  [id number]

## 2013-06-14 ENCOUNTER — Encounter (HOSPITAL_COMMUNITY): Payer: Self-pay | Admitting: Obstetrics and Gynecology

## 2014-09-08 ENCOUNTER — Other Ambulatory Visit: Payer: Self-pay | Admitting: Family Medicine

## 2014-09-08 ENCOUNTER — Ambulatory Visit
Admission: RE | Admit: 2014-09-08 | Discharge: 2014-09-08 | Disposition: A | Discharge status: Home / Self Care | Payer: BC Managed Care – PPO | Source: Ambulatory Visit | Attending: Family Medicine | Admitting: Family Medicine

## 2014-09-08 DIAGNOSIS — R059 Cough, unspecified: Secondary | ICD-10-CM

## 2014-09-08 DIAGNOSIS — R05 Cough: Secondary | ICD-10-CM

## 2015-05-02 ENCOUNTER — Encounter (HOSPITAL_COMMUNITY): Payer: Self-pay | Admitting: *Deleted

## 2015-05-02 ENCOUNTER — Other Ambulatory Visit: Payer: Self-pay | Admitting: Obstetrics and Gynecology

## 2015-05-19 ENCOUNTER — Ambulatory Visit (HOSPITAL_COMMUNITY): Payer: BC Managed Care – PPO | Admitting: Anesthesiology

## 2015-05-19 ENCOUNTER — Ambulatory Visit (HOSPITAL_COMMUNITY)
Admission: RE | Admit: 2015-05-19 | Discharge: 2015-05-19 | Disposition: A | Discharge status: Home / Self Care | Payer: BC Managed Care – PPO | Source: Ambulatory Visit | Attending: Obstetrics and Gynecology | Admitting: Obstetrics and Gynecology

## 2015-05-19 ENCOUNTER — Encounter (HOSPITAL_COMMUNITY)
Admission: RE | Disposition: A | Discharge status: Home / Self Care | Payer: Self-pay | Source: Ambulatory Visit | Attending: Obstetrics and Gynecology

## 2015-05-19 ENCOUNTER — Encounter (HOSPITAL_COMMUNITY): Payer: Self-pay

## 2015-05-19 DIAGNOSIS — R938 Abnormal findings on diagnostic imaging of other specified body structures: Secondary | ICD-10-CM | POA: Diagnosis not present

## 2015-05-19 DIAGNOSIS — N920 Excessive and frequent menstruation with regular cycle: Secondary | ICD-10-CM | POA: Insufficient documentation

## 2015-05-19 HISTORY — PX: DILITATION & CURRETTAGE/HYSTROSCOPY WITH NOVASURE ABLATION: SHX5568

## 2015-05-19 LAB — CBC
HEMATOCRIT: 36.1 % (ref 36.0–46.0)
Hemoglobin: 11.9 g/dL — ABNORMAL LOW (ref 12.0–15.0)
MCH: 26.7 pg (ref 26.0–34.0)
MCHC: 33 g/dL (ref 30.0–36.0)
MCV: 80.9 fL (ref 78.0–100.0)
Platelets: 223 10*3/uL (ref 150–400)
RBC: 4.46 MIL/uL (ref 3.87–5.11)
RDW: 15.1 % (ref 11.5–15.5)
WBC: 6 10*3/uL (ref 4.0–10.5)

## 2015-05-19 SURGERY — DILATATION & CURETTAGE/HYSTEROSCOPY WITH NOVASURE ABLATION
Anesthesia: General | Site: Vagina

## 2015-05-19 MED ORDER — ONDANSETRON HCL 4 MG/2ML IJ SOLN
4.0000 mg | Freq: Once | INTRAMUSCULAR | Status: DC | PRN
Start: 2015-05-19 — End: 2015-05-19

## 2015-05-19 MED ORDER — ONDANSETRON HCL 4 MG/2ML IJ SOLN
INTRAMUSCULAR | Status: DC | PRN
  Administered 2015-05-19: 4 mg via INTRAVENOUS

## 2015-05-19 MED ORDER — LIDOCAINE HCL (CARDIAC) 20 MG/ML IV SOLN
INTRAVENOUS | Status: AC
  Filled 2015-05-19: qty 5

## 2015-05-19 MED ORDER — KETOROLAC TROMETHAMINE 30 MG/ML IJ SOLN
INTRAMUSCULAR | Status: DC | PRN
  Administered 2015-05-19: 30 mg via INTRAVENOUS
  Administered 2015-05-19: 30 mg via INTRAMUSCULAR

## 2015-05-19 MED ORDER — DEXAMETHASONE SODIUM PHOSPHATE 4 MG/ML IJ SOLN
INTRAMUSCULAR | Status: DC | PRN
  Administered 2015-05-19: 10 mg via INTRAVENOUS

## 2015-05-19 MED ORDER — PROPOFOL 10 MG/ML IV BOLUS
INTRAVENOUS | Status: AC
  Filled 2015-05-19: qty 20

## 2015-05-19 MED ORDER — KETOROLAC TROMETHAMINE 30 MG/ML IJ SOLN
INTRAMUSCULAR | Status: AC
  Filled 2015-05-19: qty 1

## 2015-05-19 MED ORDER — FENTANYL CITRATE (PF) 100 MCG/2ML IJ SOLN
INTRAMUSCULAR | Status: DC | PRN
  Administered 2015-05-19: 50 ug via INTRAVENOUS

## 2015-05-19 MED ORDER — PROPOFOL 10 MG/ML IV BOLUS
INTRAVENOUS | Status: DC | PRN
  Administered 2015-05-19: 200 mg via INTRAVENOUS

## 2015-05-19 MED ORDER — HYDROCODONE-ACETAMINOPHEN 7.5-325 MG PO TABS: 1.0000 | ORAL_TABLET | Freq: Once | ORAL | Status: DC | PRN

## 2015-05-19 MED ORDER — FENTANYL CITRATE (PF) 100 MCG/2ML IJ SOLN: 25.0000 ug | INTRAMUSCULAR | Status: DC | PRN

## 2015-05-19 MED ORDER — HYDROCODONE-ACETAMINOPHEN 5-300 MG PO TABS: 1.0000 | ORAL_TABLET | Freq: Four times a day (QID) | ORAL | Status: DC | PRN

## 2015-05-19 MED ORDER — MIDAZOLAM HCL 5 MG/5ML IJ SOLN
INTRAMUSCULAR | Status: DC | PRN
  Administered 2015-05-19: 2 mg via INTRAVENOUS

## 2015-05-19 MED ORDER — LACTATED RINGERS IV SOLN
INTRAVENOUS | Status: DC
  Administered 2015-05-19 (×2): via INTRAVENOUS

## 2015-05-19 MED ORDER — KETOROLAC TROMETHAMINE 30 MG/ML IJ SOLN: 30.0000 mg | Freq: Once | INTRAMUSCULAR | Status: DC | PRN

## 2015-05-19 MED ORDER — ONDANSETRON HCL 4 MG/2ML IJ SOLN
INTRAMUSCULAR | Status: AC
  Filled 2015-05-19: qty 2

## 2015-05-19 MED ORDER — IBUPROFEN 800 MG PO TABS: 800.0000 mg | ORAL_TABLET | Freq: Three times a day (TID) | ORAL | Status: DC | PRN

## 2015-05-19 MED ORDER — MEPERIDINE HCL 25 MG/ML IJ SOLN: 6.2500 mg | INTRAMUSCULAR | Status: DC | PRN

## 2015-05-19 MED ORDER — SCOPOLAMINE 1 MG/3DAYS TD PT72
1.0000 | MEDICATED_PATCH | Freq: Once | TRANSDERMAL | Status: DC
  Administered 2015-05-19: 1.5 mg via TRANSDERMAL

## 2015-05-19 MED ORDER — SCOPOLAMINE 1 MG/3DAYS TD PT72
MEDICATED_PATCH | TRANSDERMAL | Status: AC
  Administered 2015-05-19: 1.5 mg via TRANSDERMAL
  Filled 2015-05-19: qty 1

## 2015-05-19 MED ORDER — MIDAZOLAM HCL 2 MG/2ML IJ SOLN
INTRAMUSCULAR | Status: AC
  Filled 2015-05-19: qty 2

## 2015-05-19 MED ORDER — DEXAMETHASONE SODIUM PHOSPHATE 10 MG/ML IJ SOLN
INTRAMUSCULAR | Status: AC
  Filled 2015-05-19: qty 1

## 2015-05-19 MED ORDER — FENTANYL CITRATE (PF) 100 MCG/2ML IJ SOLN
INTRAMUSCULAR | Status: AC
  Filled 2015-05-19: qty 2

## 2015-05-19 MED ORDER — LIDOCAINE HCL (CARDIAC) 20 MG/ML IV SOLN
INTRAVENOUS | Status: DC | PRN
  Administered 2015-05-19: 100 mg via INTRAVENOUS

## 2015-05-19 MED ORDER — CHLOROPROCAINE HCL 1 % IJ SOLN
INTRAMUSCULAR | Status: AC
  Filled 2015-05-19: qty 30

## 2015-05-19 SURGICAL SUPPLY — 14 items
ABLATOR ENDOMETRIAL BIPOLAR (ABLATOR) ×3 IMPLANT
CATH ROBINSON RED A/P 16FR (CATHETERS) ×3 IMPLANT
CLOTH BEACON ORANGE TIMEOUT ST (SAFETY) ×3 IMPLANT
CONTAINER PREFILL 10% NBF 60ML (FORM) ×2 IMPLANT
GLOVE BIOGEL PI IND STRL 7.0 (GLOVE) ×2 IMPLANT
GLOVE BIOGEL PI INDICATOR 7.0 (GLOVE) ×4
GLOVE ECLIPSE 6.5 STRL STRAW (GLOVE) ×3 IMPLANT
GOWN STRL REUS W/TWL LRG LVL3 (GOWN DISPOSABLE) ×6 IMPLANT
PACK VAGINAL MINOR WOMEN LF (CUSTOM PROCEDURE TRAY) ×3 IMPLANT
PAD OB MATERNITY 4.3X12.25 (PERSONAL CARE ITEMS) ×3 IMPLANT
TOWEL OR 17X24 6PK STRL BLUE (TOWEL DISPOSABLE) ×6 IMPLANT
TUBING AQUILEX INFLOW (TUBING) ×3 IMPLANT
TUBING AQUILEX OUTFLOW (TUBING) ×3 IMPLANT
WATER STERILE IRR 1000ML POUR (IV SOLUTION) ×3 IMPLANT

## 2015-05-19 NOTE — Discharge Instructions (Signed)
CALL  IF TEMP>100.4, NOTHING PER VAGINA X 1 WK, CALL IF SOAKING A MAXI  PAD EVERY HOUR OR MORE FREQUENTLYDISCHARGE INSTRUCTIONS: D&C / D&E °The following instructions have been prepared to help you care for yourself upon your return home. °  °Personal hygiene: °• Use sanitary pads for vaginal drainage, not tampons. °• Shower the day after your procedure. °• NO tub baths, pools or Jacuzzis for 2-3 weeks. °• Wipe front to back after using the bathroom. ° °Activity and limitations: °• Do NOT drive or operate any equipment for 24 hours. The effects of anesthesia are still present and drowsiness may result. °• Do NOT rest in bed all day. °• Walking is encouraged. °• Walk up and down stairs slowly. °• You may resume your normal activity in one to two days or as indicated by your physician. ° °Sexual activity: NO intercourse for at least 2 weeks after the procedure, or as indicated by your physician. ° °Diet: Eat a light meal as desired this evening. You may resume your usual diet tomorrow. ° °Return to work: You may resume your work activities in one to two days or as indicated by your doctor. ° °What to expect after your surgery: Expect to have vaginal bleeding/discharge for 2-3 days and spotting for up to 10 days. It is not unusual to have soreness for up to 1-2 weeks. You may have a slight burning sensation when you urinate for the first day. Mild cramps may continue for a couple of days. You may have a regular period in 2-6 weeks. ° °Call your doctor for any of the following: °• Excessive vaginal bleeding, saturating and changing one pad every hour. °• Inability to urinate 6 hours after discharge from hospital. °• Pain not relieved by pain medication. °• Fever of 100.4° F or greater. °• Unusual vaginal discharge or odor. ° ° Call for an appointment:  ° ° °Patient’s signature: ______________________ ° °Nurse’s signature ________________________ ° °Support person's signature_______________________ ° ° ° °Post Anesthesia  Home Care Instructions ° °Activity: °Get plenty of rest for the remainder of the day. A responsible adult should stay with you for 24 hours following the procedure.  °For the next 24 hours, DO NOT: °-Drive a car °-Operate machinery °-Drink alcoholic beverages °-Take any medication unless instructed by your physician °-Make any legal decisions or sign important papers. ° °Meals: °Start with liquid foods such as gelatin or soup. Progress to regular foods as tolerated. Avoid greasy, spicy, heavy foods. If nausea and/or vomiting occur, drink only clear liquids until the nausea and/or vomiting subsides. Call your physician if vomiting continues. ° °Special Instructions/Symptoms: °Your throat may feel dry or sore from the anesthesia or the breathing tube placed in your throat during surgery. If this causes discomfort, gargle with warm salt water. The discomfort should disappear within 24 hours. ° °If you had a scopolamine patch placed behind your ear for the management of post- operative nausea and/or vomiting: ° °1. The medication in the patch is effective for 72 hours, after which it should be removed.  Wrap patch in a tissue and discard in the trash. Wash hands thoroughly with soap and water. °2. You may remove the patch earlier than 72 hours if you experience unpleasant side effects which may include dry mouth, dizziness or visual disturbances. °3. Avoid touching the patch. Wash your hands with soap and water after contact with the patch. °  ° °

## 2015-05-19 NOTE — Brief Op Note (Signed)
05/19/2015  2:42 PM  PATIENT:  Carolyn Hood  34 y.o. female  PRE-OPERATIVE DIAGNOSIS:  Menorrhagia, Endometrial Mass  POST-OPERATIVE DIAGNOSIS:  Menorrhagia, Endometrial thickening  PROCEDURE:  Diagnostic hysteroscopy, dilation and curettage, Novasure endometrial ablation  SURGEON:  Surgeon(s) and Role:    * Maxie BetterSheronette Evelynne Spiers, MD - Primary  PHYSICIAN ASSISTANT:   ASSISTANTS: none   ANESTHESIA:   general  EBL:  Total I/O In: 1000 [I.V.:1000] Out: 105 [Urine:100; Blood:5]  BLOOD ADMINISTERED:none  DRAINS: none   LOCAL MEDICATIONS USED:  NONE  SPECIMEN:  Source of Specimen:  EMC  DISPOSITION OF SPECIMEN:  PATHOLOGY  COUNTS:  YES  TOURNIQUET:  * No tourniquets in log *  DICTATION: .Other Dictation: Dictation Number [id number]  PLAN OF CARE: Discharge to home after PACU  PATIENT DISPOSITION:  PACU - hemodynamically stable.   Delay start of Pharmacological VTE agent (>24hrs) due to surgical blood loss or risk of bleeding: no

## 2015-05-19 NOTE — Anesthesia Postprocedure Evaluation (Signed)
Anesthesia Post Note  Patient: Carolyn Hood  Procedure(s) Performed: Procedure(s) (LRB): DILATATION & CURETTAGE/HYSTEROSCOPY WITH Resectoscope and NOVASURE ABLATION (N/A)  Patient location during evaluation: PACU Anesthesia Type: General Level of consciousness: awake Pain management: pain level controlled Vital Signs Assessment: post-procedure vital signs reviewed and stable Respiratory status: spontaneous breathing Cardiovascular status: stable Postop Assessment: no signs of nausea or vomiting Anesthetic complications: no    Last Vitals:  Filed Vitals:   05/19/15 1515 05/19/15 1545  BP: 118/79 131/86  Pulse: 58 60  Temp:  37.3 C  Resp: 13 12    Last Pain: There were no vitals filed for this visit.               Raena Pau JR,JOHN Susann GivensFRANKLIN

## 2015-05-19 NOTE — H&P (Signed)
Carolyn Hood is an 34 y.o. female.[name] MBF presents for diagnostic hysteroscopy, D&C, endometrial ablation 2nd to menorrhagia  Pertinent Gynecological History: Menses: flow is excessive with use of 5-6 pads or tampons on heaviest days Bleeding: menorrhagia Contraception: none DES exposure: denies Blood transfusions: none Sexually transmitted diseases: trichomoniasis Previous GYN Procedures: DNC and dx hysteroscopy  Last mammogram: n/a Date: n/a Last pap: normal Date: 2016 OB History: G3, P2   Menstrual History: Menarche age: n/a Patient's last menstrual period was 05/06/2015.    Past Medical History  Diagnosis Date  . Headache(784.0)     migraines    Past Surgical History  Procedure Laterality Date  . Dilation and curettage of uterus    . Cervical conization w/bx    . Colonoscopy    . Dilatation & currettage/hysteroscopy with resectocope N/A 06/11/2013    Procedure: DILATATION & CURETTAGE/HYSTEROSCOPY WITH RESECTOCOPE;  Surgeon: [name], MD;  Location: [facility];  Service: Gynecology;  Laterality: N/A;    History reviewed. No pertinent family history.  Social History:  reports that she has never smoked. She does not have any smokeless tobacco history on file. She reports that she drinks alcohol. She reports that she does not use illicit drugs.  Allergies: No Known Allergies  Prescriptions prior to admission  Medication Sig Dispense Refill Last Dose  . ferrous sulfate 325 (65 FE) MG EC tablet Take 325 mg by mouth 2 (two) times daily.   05/18/2015 at Unknown time  . ibuprofen (ADVIL,MOTRIN) 200 MG tablet Take 400 mg by mouth every 6 (six) hours as needed for mild pain.   Past Week at Unknown time    Review of Systems  All other systems reviewed and are negative.   Blood pressure 105/72, pulse 86, temperature 98.1 F (36.7 C), temperature source Oral, resp. rate 16, height 5\' 3"  (1.6 m), weight 62.143 kg (137 lb), last menstrual period 05/06/2015, SpO2  100 %. Physical Exam  Constitutional: She is oriented to person, place, and time. She appears well-developed and well-nourished.  HENT:  Head: Atraumatic.  Eyes: EOM are normal.  Neck: Neck supple.  Cardiovascular: Normal rate and regular rhythm.   Musculoskeletal: Normal range of motion.  Neurological: She is alert and oriented to person, place, and time.  Skin: Skin is warm and dry.  Psychiatric: She has a normal mood and affect.  vulva nl  Vagina nl Cervix parous Adnexa: nl Uterus sl enlarged  Results for orders placed or performed during the hospital encounter of 05/19/15 (from the past 24 hour(s))  CBC     Status: Abnormal   Collection Time: 05/19/15 11:30 AM  Result Value Ref Range   WBC 6.0 4.0 - 10.5 K/uL   RBC 4.46 3.87 - 5.11 MIL/uL   Hemoglobin 11.9 (L) 12.0 - 15.0 g/dL   HCT 47.836.1 29.536.0 - 62.146.0 %   MCV 80.9 78.0 - 100.0 fL   MCH 26.7 26.0 - 34.0 pg   MCHC 33.0 30.0 - 36.0 g/dL   RDW 30.815.1 65.711.5 - 84.615.5 %   Platelets 223 150 - 400 K/uL    No results found.  Assessment/Plan: Menorrhagia P) dx hysteroscopy, D&C, endometrial ablation. Risk of surgery include infection, bleeding, injury to surrounding organ structures, uterine perforation and its risk, thermal injury, fluid overload  [name] A 05/19/2015, 1:21 PM

## 2015-05-19 NOTE — Transfer of Care (Signed)
Immediate Anesthesia Transfer of Care Note  Patient: Carolyn Hood L Lenker  Procedure(s) Performed: Procedure(s): DILATATION & CURETTAGE/HYSTEROSCOPY WITH Resectoscope and NOVASURE ABLATION (N/A)  Patient Location: PACU  Anesthesia Type:General  Level of Consciousness: awake, alert  and oriented  Airway & Oxygen Therapy: Patient Spontanous Breathing and Patient connected to nasal cannula oxygen  Post-op Assessment: Report given to RN and Post -op Vital signs reviewed and stable  Post vital signs: Reviewed and stable  Last Vitals:  Filed Vitals:   05/19/15 1129  BP: 105/72  Pulse: 86  Temp: 36.7 C  Resp: 16    Complications: No apparent anesthesia complications

## 2015-05-19 NOTE — Anesthesia Procedure Notes (Signed)
Procedure Name: LMA Insertion Date/Time: 05/19/2015 2:03 PM Performed by: Junious SilkGILBERT, Jayvan Mcshan Pre-anesthesia Checklist: Patient identified, Emergency Drugs available, Suction available, Patient being monitored and Timeout performed Patient Re-evaluated:Patient Re-evaluated prior to inductionOxygen Delivery Method: Circle system utilized Preoxygenation: Pre-oxygenation with 100% oxygen Intubation Type: IV induction Ventilation: Mask ventilation without difficulty LMA: LMA inserted LMA Size: 4.0 Number of attempts: 1 Placement Confirmation: positive ETCO2,  CO2 detector and breath sounds checked- equal and bilateral Tube secured with: Tape Dental Injury: Teeth and Oropharynx as per pre-operative assessment

## 2015-05-19 NOTE — Anesthesia Preprocedure Evaluation (Signed)
Anesthesia Evaluation  Patient identified by MRN, date of birth, ID band Patient awake    Reviewed: Allergy & Precautions, H&P , NPO status , Patient's Chart, lab work & pertinent test results  Airway Mallampati: II  TM Distance: >3 FB Neck ROM: full    Dental no notable dental hx.    Pulmonary neg pulmonary ROS,    Pulmonary exam normal        Cardiovascular negative cardio ROS Normal cardiovascular exam     Neuro/Psych negative neurological ROS  negative psych ROS   GI/Hepatic negative GI ROS, Neg liver ROS,   Endo/Other  negative endocrine ROS  Renal/GU negative Renal ROS     Musculoskeletal   Abdominal Normal abdominal exam  (+)   Peds  Hematology negative hematology ROS (+)   Anesthesia Other Findings   Reproductive/Obstetrics negative OB ROS                             Anesthesia Physical Anesthesia Plan  ASA: II  Anesthesia Plan: General   Post-op Pain Management:    Induction: Intravenous  Airway Management Planned: LMA  Additional Equipment:   Intra-op Plan:   Post-operative Plan:   Informed Consent: I have reviewed the patients History and Physical, chart, labs and discussed the procedure including the risks, benefits and alternatives for the proposed anesthesia with the patient or authorized representative who has indicated his/her understanding and acceptance.     Plan Discussed with: CRNA and Surgeon  Anesthesia Plan Comments:         Anesthesia Quick Evaluation

## 2015-05-21 NOTE — Op Note (Signed)
NAMConsuello Hood:  Hood, Carolyn                ACCOUNT NO.:  [id number]  MEDICAL RECORD NO.:  [id number]  LOCATION:  [facility]                          FACILITY:  [facility]  PHYSICIAN:  Maxie BetterSheronette Donnarae Rae, M.D.DATE OF BIRTH:  [birth year]  DATE OF PROCEDURE:  05/19/2015 DATE OF DISCHARGE:  05/19/2015                              OPERATIVE REPORT   PREOPERATIVE DIAGNOSES:  Menorrhagia, endometrial mass.  PROCEDURE:  Diagnostic hysteroscopy, dilation and curettage, NovaSure endometrial ablation.  POSTOPERATIVE DIAGNOSES:  Menorrhagia, endometrial thickening.  SURGEON:  Maxie BetterSheronette Dalton Mille, MD.  ANESTHESIA:  General.  ASSISTANT:  None.  DESCRIPTION OF PROCEDURE:  Under adequate general anesthesia, the patient was placed in a dorsal lithotomy position.  She was sterilely prepped and draped in usual fashion.  The bladder was catheterized for moderate amount of urine.  Examination under anesthesia revealed anteverted uterus.  No adnexal masses could be appreciated.  A bivalve speculum was placed in the vagina, a single-tooth tenaculum was placed on the anterior lip of the cervix.  The cervix was then dilated up to a #19 Pratt dilator.  A MyoSure hysteroscope was introduced into the uterine cavity.  Both tubal ostia were seen well.  Endometrial thickening on the posterior aspect of the uterus was noted.  No discrete mass is seen.  The hysteroscope was removed.  The cavity was then curetted for a moderate amount of tissue.  The uterus sounded to 7.5 cm. The endocervical canal sounded to 3.5 cm given a uterine length of 4 cm. The NovaSure apparatus was then subsequently inserted into the uterine cavity, a width of 2.5 was noted.  After testing, an ablation at the level of power of 55 watts and 127 seconds of ablation was accomplished. The NovaSure apparatus was then removed.  The hysteroscope was reinserted.  Good endometrial ablation throughout the cavity was seen. At that point, all instruments were then  removed from the vagina.  SPECIMEN LABELED:  Endometrial curetting was sent to Pathology.  ESTIMATED BLOOD LOSS:  5 mL.  INTRAOPERATIVE FLUID:  1 L.  COUNTS:  Sponge and instrument counts x2 was correct.  COMPLICATION:  None.  The patient tolerated the procedure well, was transferred to recovery room in stable condition.     Maxie BetterSheronette Zoraya Fiorenza, M.D.     /MEDQ  D:  05/20/2015  T:  05/21/2015  Job:  [id number]

## 2015-05-23 ENCOUNTER — Encounter (HOSPITAL_COMMUNITY): Payer: Self-pay | Admitting: Obstetrics and Gynecology

## 2016-07-03 IMAGING — CR DG CHEST 2V
2 series · 2 of 2 positions shown · non-contrast
Comparison: None.

CLINICAL DATA: Cough

EXAM:
CHEST  2 VIEW

[view not recorded (1 of 2)]
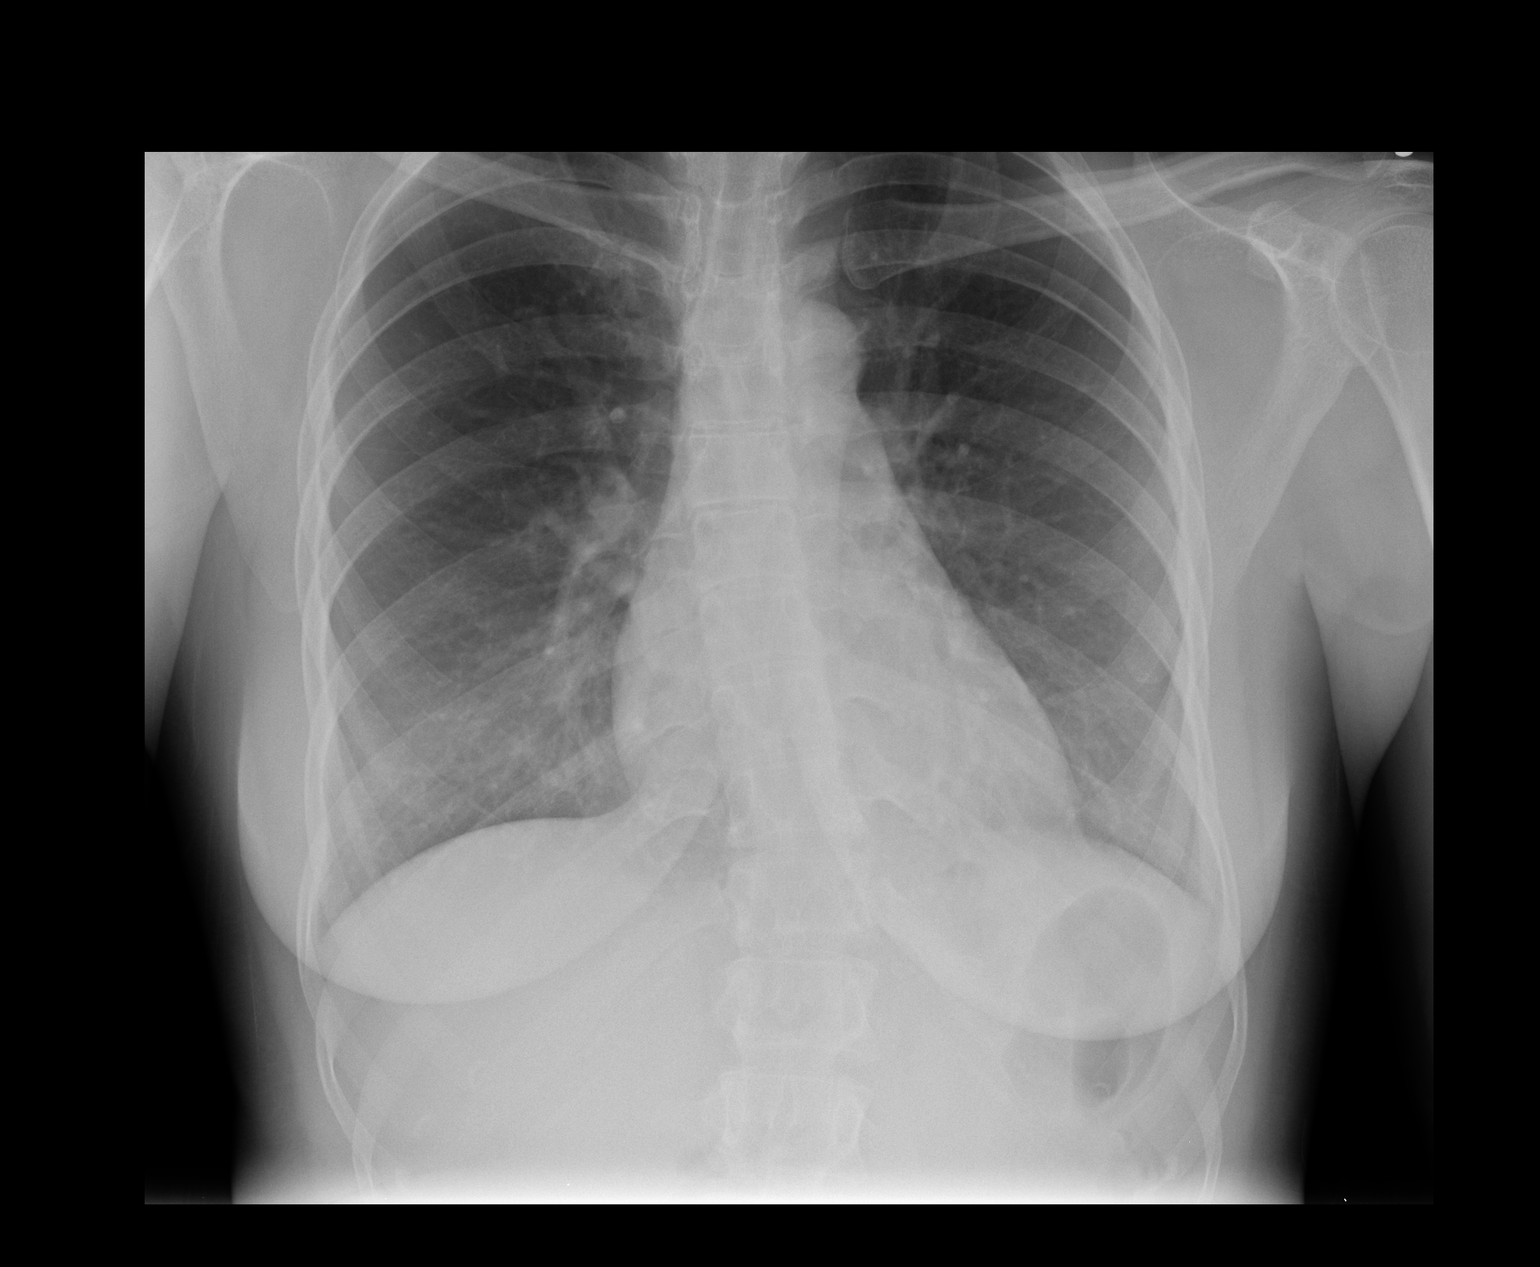

[view not recorded (2 of 2)]
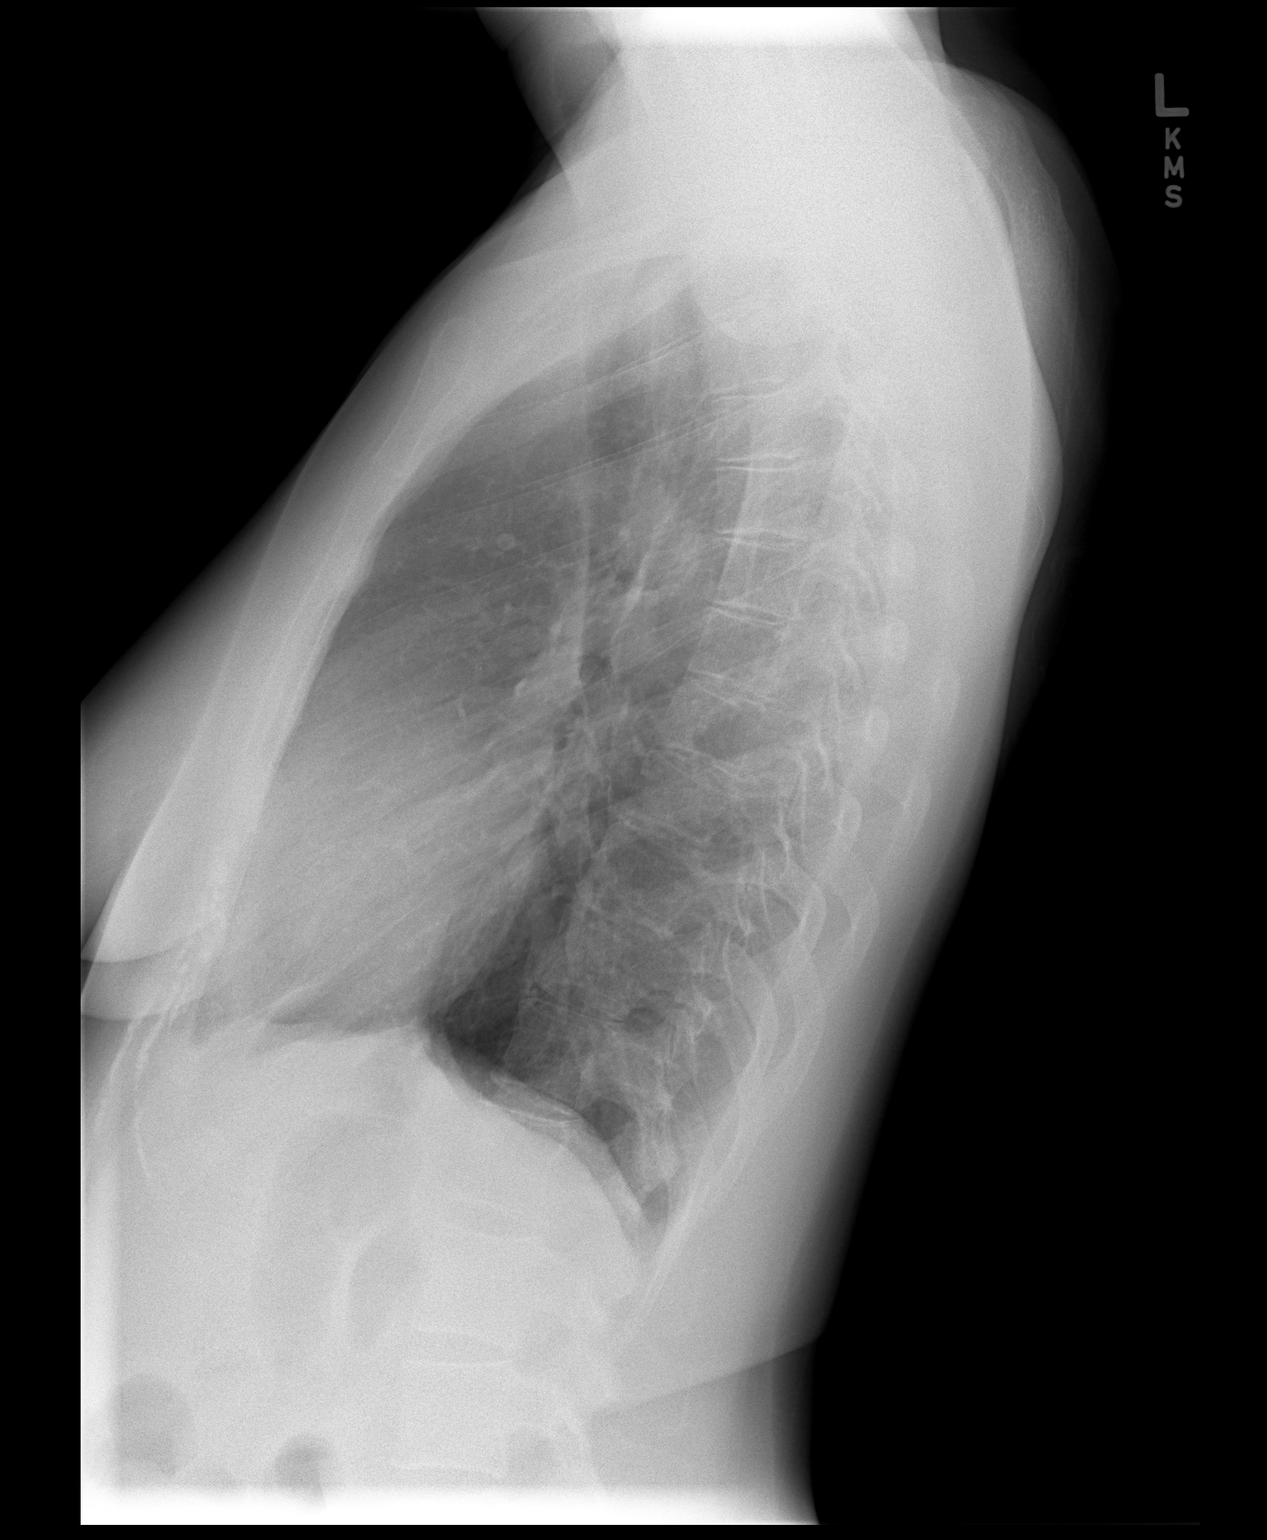

[2 of 2 positions shown; findings below may reference images not displayed]

FINDINGS: Cardiomediastinal silhouette is unremarkable. No acute infiltrate or
pleural effusion. No pulmonary edema. Bony thorax is unremarkable.
IMPRESSION: No active cardiopulmonary disease.

## 2017-01-28 ENCOUNTER — Encounter (HOSPITAL_COMMUNITY): Payer: Self-pay | Admitting: *Deleted

## 2017-01-28 ENCOUNTER — Ambulatory Visit (HOSPITAL_COMMUNITY)
Admission: EM | Admit: 2017-01-28 | Discharge: 2017-01-28 | Disposition: A | Discharge status: Home / Self Care | Payer: BC Managed Care – PPO | Attending: Family Medicine | Admitting: Family Medicine

## 2017-01-28 DIAGNOSIS — M5432 Sciatica, left side: Secondary | ICD-10-CM | POA: Diagnosis not present

## 2017-01-28 MED ORDER — PREDNISONE 20 MG PO TABS: ORAL_TABLET | ORAL | 0 refills | Status: AC

## 2017-01-28 NOTE — ED Provider Notes (Signed)
[facility]    CSN: [id number] Arrival date & time: 01/28/17  1747     History   Chief Complaint Chief Complaint  Patient presents with  . Leg Pain    HPI Carolyn Hood is a 36 y.o. female.   Numbness in Lt toe Lt leg pain x 1 week.  No new activity.  Does office work.  Notes some thickness on plantar aspect of left fourth toe.  Not aggravated by walking or SLR  No weakness, bladder-bowel symptoms, abdominal pain, or fever.      Past Medical History:  Diagnosis Date  . Headache(784.0)    migraines    There are no active problems to display for this patient.   Past Surgical History:  Procedure Laterality Date  . CERVICAL CONIZATION W/BX    . COLONOSCOPY    . DILATATION & CURRETTAGE/HYSTEROSCOPY WITH RESECTOCOPE N/A 06/11/2013   Procedure: DILATATION & CURETTAGE/HYSTEROSCOPY WITH RESECTOCOPE;  Surgeon: Serita KyleSheronette A Cousins, MD;  Location: [facility];  Service: Gynecology;  Laterality: N/A;  . DILATION AND CURETTAGE OF UTERUS    . DILITATION & CURRETTAGE/HYSTROSCOPY WITH NOVASURE ABLATION N/A 05/19/2015   Procedure: DILATATION & CURETTAGE/HYSTEROSCOPY WITH Resectoscope and NOVASURE ABLATION;  Surgeon: Maxie BetterSheronette Cousins, MD;  Location: [facility];  Service: Gynecology;  Laterality: N/A;    OB History    No data available       Home Medications    Prior to Admission medications   Medication Sig Start Date End Date Taking? Authorizing Provider  ferrous sulfate 325 (65 FE) MG EC tablet Take 325 mg by mouth 2 (two) times daily.    [provider]  predniSONE (DELTASONE) 20 MG tablet Two daily with food 01/28/17   Elvina SidleLauenstein, Betty Brooks, MD    Family History History reviewed. No pertinent family history.  Social History Social History  Substance Use Topics  . Smoking status: Never Smoker  . Smokeless tobacco: Not on file  . Alcohol use Yes     Comment: social     Allergies   Patient has no known allergies.   Review of Systems Review of  Systems  Neurological: Positive for numbness.  All other systems reviewed and are negative.    Physical Exam Triage Vital Signs ED Triage Vitals  Enc Vitals Group     BP      Pulse      Resp      Temp      Temp src      SpO2      Weight      Height      Head Circumference      Peak Flow      Pain Score      Pain Loc      Pain Edu?      Excl. in GC?    No data found.   Updated Vital Signs BP (!) 142/90 (BP Location: Left Arm)   Pulse 78   Temp 98.6 F (37 C) (Oral)   Resp 18   SpO2 100%    Physical Exam  Constitutional: She is oriented to person, place, and time. She appears well-developed and well-nourished.  HENT:  Head: Normocephalic.  Right Ear: External ear normal.  Left Ear: External ear normal.  Mouth/Throat: Oropharynx is clear and moist.  Eyes: Pupils are equal, round, and reactive to light. Conjunctivae are normal.  Neck: Normal range of motion. Neck supple.  Musculoskeletal: Normal range of motion.  Neurological: She is alert and oriented  to person, place, and time.  Skin: Skin is warm and dry.  Nursing note and vitals reviewed.    UC Treatments / Results  Labs (all labs ordered are listed, but only abnormal results are displayed) Labs Reviewed - No data to display  EKG  EKG Interpretation None       Radiology No results found.  Procedures Procedures (including critical care time)  Medications Ordered in UC Medications - No data to display   Initial Impression / Assessment and Plan / UC Course  I have reviewed the triage vital signs and the nursing notes.  Pertinent labs & imaging results that were available during my care of the patient were reviewed by me and considered in my medical decision making (see chart for details).     Final Clinical Impressions(s) / UC Diagnoses   Final diagnoses:  Sciatica of left side    New Prescriptions New Prescriptions   PREDNISONE (DELTASONE) 20 MG TABLET    Two daily with food      Controlled Substance Prescriptions [place] Controlled Substance Registry consulted? Not Applicable   Elvina Sidle, MD 01/28/17 [phone]

## 2017-01-28 NOTE — ED Triage Notes (Signed)
Pt  Reports  l  Leg  Pain   X   4  Days   With  Pain  From  The  uppe  Thigh     To  The  Back of  l     Calf    - she  Also  Reports  Numbness of  4 th   l   Toe   Denies   Any injury    Denies  Any  Chest pain  Or  Shortness  Of  Breath

## 2019-07-25 ENCOUNTER — Ambulatory Visit: Payer: BC Managed Care – PPO | Attending: Internal Medicine

## 2019-07-25 DIAGNOSIS — Z23 Encounter for immunization: Secondary | ICD-10-CM

## 2019-07-25 NOTE — Progress Notes (Signed)
   Covid-19 Vaccination Clinic  Name:  Carolyn Hood    MRN: [id number] DOB: [birth year]  07/25/2019  Carolyn Hood was observed post Covid-19 immunization for 15 minutes without incident. She was provided with Vaccine Information Sheet and instruction to access the V-Safe system.   Carolyn Hood was instructed to call 911 with any severe reactions post vaccine: Marland Kitchen Difficulty breathing  . Swelling of face and throat  . A fast heartbeat  . A bad rash all over body  . Dizziness and weakness   Immunizations Administered    Name Date Dose VIS Date Route   Pfizer COVID-19 Vaccine 07/25/2019  1:21 PM 0.3 mL 04/30/2019 Intramuscular   Manufacturer: [employer], [employer]   Lot: [id number]   NDC: [address]-9

## 2019-08-25 ENCOUNTER — Ambulatory Visit: Payer: BC Managed Care – PPO | Attending: Internal Medicine

## 2019-08-25 DIAGNOSIS — Z23 Encounter for immunization: Secondary | ICD-10-CM

## 2019-08-25 NOTE — Progress Notes (Signed)
   Covid-19 Vaccination Clinic  Name:  Carolyn Hood    MRN: [id number] DOB: October 20, 1980  08/25/2019  [name] was observed post Covid-19 immunization for 15 minutes without incident. She was provided with Vaccine Information Sheet and instruction to access the V-Safe system.   [name] was instructed to call 911 with any severe reactions post vaccine: Marland Kitchen Difficulty breathing  . Swelling of face and throat  . A fast heartbeat  . A bad rash all over body  . Dizziness and weakness   Immunizations Administered    Name Date Dose VIS Date Route   Pfizer COVID-19 Vaccine 08/25/2019  2:01 PM 0.3 mL 04/30/2019 Intramuscular   Manufacturer: [employer], [employer]   Lot: [id number]   NDC: [address]-5

## 2022-01-09 ENCOUNTER — Other Ambulatory Visit (HOSPITAL_COMMUNITY): Payer: Self-pay | Admitting: Family Medicine

## 2022-01-09 DIAGNOSIS — E785 Hyperlipidemia, unspecified: Secondary | ICD-10-CM

## 2022-01-18 ENCOUNTER — Ambulatory Visit (HOSPITAL_COMMUNITY)
Admission: RE | Admit: 2022-01-18 | Discharge: 2022-01-18 | Disposition: A | Discharge status: Home / Self Care | Payer: BC Managed Care – PPO | Source: Ambulatory Visit | Attending: Family Medicine | Admitting: Family Medicine

## 2022-01-18 DIAGNOSIS — E785 Hyperlipidemia, unspecified: Secondary | ICD-10-CM | POA: Insufficient documentation
